# Patient Record
Sex: Female | Born: 2000 | Race: White | Hispanic: No | Marital: Single | Smoking: Never smoker
Health system: Southern US, Community
[De-identification: ages and names within clinical notes are randomized; demographics above are authoritative.]

## PROBLEM LIST (undated history)

## (undated) DIAGNOSIS — O24419 Gestational diabetes mellitus in pregnancy, unspecified control: Secondary | ICD-10-CM

## (undated) DIAGNOSIS — Z789 Other specified health status: Secondary | ICD-10-CM

## (undated) DIAGNOSIS — F909 Attention-deficit hyperactivity disorder, unspecified type: Secondary | ICD-10-CM

## (undated) DIAGNOSIS — O139 Gestational [pregnancy-induced] hypertension without significant proteinuria, unspecified trimester: Secondary | ICD-10-CM

## (undated) DIAGNOSIS — F419 Anxiety disorder, unspecified: Secondary | ICD-10-CM

## (undated) DIAGNOSIS — I10 Essential (primary) hypertension: Secondary | ICD-10-CM

## (undated) DIAGNOSIS — F32A Depression, unspecified: Secondary | ICD-10-CM

## (undated) HISTORY — PX: NO PAST SURGERIES: SHX2092

## (undated) HISTORY — DX: Attention-deficit hyperactivity disorder, unspecified type: F90.9

---
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
## 2020-01-03 ENCOUNTER — Ambulatory Visit (INDEPENDENT_AMBULATORY_CARE_PROVIDER_SITE_OTHER): Payer: Medicaid Other

## 2020-01-03 ENCOUNTER — Other Ambulatory Visit: Payer: Self-pay

## 2020-01-03 DIAGNOSIS — F320 Major depressive disorder, single episode, mild: Secondary | ICD-10-CM

## 2020-01-03 DIAGNOSIS — R8271 Bacteriuria: Secondary | ICD-10-CM

## 2020-01-03 DIAGNOSIS — O10919 Unspecified pre-existing hypertension complicating pregnancy, unspecified trimester: Secondary | ICD-10-CM | POA: Diagnosis not present

## 2020-01-03 DIAGNOSIS — Z6839 Body mass index (BMI) 39.0-39.9, adult: Secondary | ICD-10-CM

## 2020-01-03 DIAGNOSIS — O0990 Supervision of high risk pregnancy, unspecified, unspecified trimester: Secondary | ICD-10-CM

## 2020-01-03 DIAGNOSIS — O24410 Gestational diabetes mellitus in pregnancy, diet controlled: Secondary | ICD-10-CM

## 2020-01-03 DIAGNOSIS — F1290 Cannabis use, unspecified, uncomplicated: Secondary | ICD-10-CM

## 2020-01-03 DIAGNOSIS — Z283 Underimmunization status: Secondary | ICD-10-CM

## 2020-01-03 DIAGNOSIS — O09899 Supervision of other high risk pregnancies, unspecified trimester: Secondary | ICD-10-CM

## 2020-01-03 DIAGNOSIS — Z3490 Encounter for supervision of normal pregnancy, unspecified, unspecified trimester: Secondary | ICD-10-CM

## 2020-01-03 DIAGNOSIS — F32A Depression, unspecified: Secondary | ICD-10-CM

## 2020-01-03 NOTE — Progress Notes (Signed)
[place] 36+5 wks,cephalic,posterior placenta gr 3,fhr 142 bpm,bilateral renal pelvic dilation,LK 9.5 mm,RK 8.6 mm,BPP 8/8,AFI 14.8 CM

## 2020-01-05 ENCOUNTER — Encounter: Payer: Self-pay | Admitting: Obstetrics & Gynecology

## 2020-01-05 ENCOUNTER — Observation Stay
Admission: EM | Admit: 2020-01-05 | Discharge: 2020-01-06 | Disposition: A | Discharge status: Home / Self Care | Payer: Medicaid Other | Attending: Obstetrics & Gynecology | Admitting: Obstetrics & Gynecology

## 2020-01-05 DIAGNOSIS — O10913 Unspecified pre-existing hypertension complicating pregnancy, third trimester: Principal | ICD-10-CM | POA: Insufficient documentation

## 2020-01-05 DIAGNOSIS — Z3A37 37 weeks gestation of pregnancy: Secondary | ICD-10-CM | POA: Diagnosis not present

## 2020-01-05 DIAGNOSIS — O24419 Gestational diabetes mellitus in pregnancy, unspecified control: Secondary | ICD-10-CM | POA: Diagnosis not present

## 2020-01-05 DIAGNOSIS — O163 Unspecified maternal hypertension, third trimester: Secondary | ICD-10-CM | POA: Diagnosis present

## 2020-01-05 HISTORY — DX: Gestational (pregnancy-induced) hypertension without significant proteinuria, unspecified trimester: O13.9

## 2020-01-05 HISTORY — DX: Essential (primary) hypertension: I10

## 2020-01-05 HISTORY — DX: Gestational diabetes mellitus in pregnancy, unspecified control: O24.419

## 2020-01-05 LAB — COMPREHENSIVE METABOLIC PANEL
ALT: 20 U/L (ref 0–44)
AST: 18 U/L (ref 15–41)
Albumin: 2.6 g/dL — ABNORMAL LOW (ref 3.5–5.0)
Alkaline Phosphatase: 132 U/L — ABNORMAL HIGH (ref 38–126)
Anion gap: 6 (ref 5–15)
BUN: 11 mg/dL (ref 6–20)
CO2: 24 mmol/L (ref 22–32)
Calcium: 9.1 mg/dL (ref 8.9–10.3)
Chloride: 106 mmol/L (ref 98–111)
Creatinine, Ser: 0.71 mg/dL (ref 0.44–1.00)
GFR, Estimated: >60 mL/min (ref >60)
Glucose, Bld: 102 mg/dL — ABNORMAL HIGH (ref 70–99)
Potassium: 4 mmol/L (ref 3.5–5.1)
Sodium: 136 mmol/L (ref 135–145)
Total Bilirubin: 0.4 mg/dL (ref 0.3–1.2)
Total Protein: 5.9 g/dL — ABNORMAL LOW (ref 6.5–8.1)

## 2020-01-05 LAB — CBC WITH DIFFERENTIAL/PLATELET
Abs Immature Granulocytes: 0.05 10*3/uL (ref 0.00–0.07)
Basophils Absolute: 0.1 10*3/uL (ref 0.0–0.1)
Basophils Relative: 0 %
Eosinophils Absolute: 0.1 10*3/uL (ref 0.0–0.5)
Eosinophils Relative: 1 %
HCT: 32.2 % — ABNORMAL LOW (ref 36.0–46.0)
Hemoglobin: 10.8 g/dL — ABNORMAL LOW (ref 12.0–15.0)
Immature Granulocytes: 0 %
Lymphocytes Relative: 28 %
Lymphs Abs: 3.5 10*3/uL (ref 0.7–4.0)
MCH: 26.5 pg (ref 26.0–34.0)
MCHC: 33.5 g/dL (ref 30.0–36.0)
MCV: 78.9 fL — ABNORMAL LOW (ref 80.0–100.0)
Monocytes Absolute: 0.8 10*3/uL (ref 0.1–1.0)
Monocytes Relative: 6 %
Neutro Abs: 7.9 10*3/uL — ABNORMAL HIGH (ref 1.7–7.7)
Neutrophils Relative %: 65 %
Platelets: 244 10*3/uL (ref 150–400)
RBC: 4.08 MIL/uL (ref 3.87–5.11)
RDW: 13.8 % (ref 11.5–15.5)
WBC: 12.4 10*3/uL — ABNORMAL HIGH (ref 4.0–10.5)
nRBC: 0 % (ref 0.0–0.2)

## 2020-01-05 LAB — URINALYSIS, ROUTINE W REFLEX MICROSCOPIC
Bilirubin Urine: NEGATIVE
Glucose, UA: NEGATIVE mg/dL
Hgb urine dipstick: NEGATIVE
Ketones, ur: NEGATIVE mg/dL
Leukocytes,Ua: NEGATIVE
Nitrite: NEGATIVE
Protein, ur: 30 mg/dL — AB
Specific Gravity, Urine: 1.019 (ref 1.005–1.030)
pH: 6 (ref 5.0–8.0)

## 2020-01-05 NOTE — OB Triage Note (Signed)
Pt is G1P0 at 19yo with c/o HTN. Has a hx of CHTN and has been prescribed medication that she doesn't take r/t side effects. Pt states no blurry vision, double vision, epigastric pain or HA. States earlier she had a headache earlier that comes and goes. She has not taken any medication for her HA. Pt reports good fetal movement and states her blood glucose has been in the normal range.

## 2020-01-06 DIAGNOSIS — O163 Unspecified maternal hypertension, third trimester: Secondary | ICD-10-CM | POA: Diagnosis present

## 2020-01-06 DIAGNOSIS — O10913 Unspecified pre-existing hypertension complicating pregnancy, third trimester: Secondary | ICD-10-CM | POA: Diagnosis not present

## 2020-01-06 LAB — PROTEIN / CREATININE RATIO, URINE
Creatinine, Urine: 169 mg/dL
Protein Creatinine Ratio: 0.13 mg/mg{Cre} (ref 0.00–0.15)
Total Protein, Urine: 22 mg/dL

## 2020-01-06 NOTE — Discharge Summary (Signed)
Patient ID: Penny Wilson MRN: [id number] DOB/AGE: 2000/08/16 19 y.o.  Admit date: 01/05/2020 Discharge date: 01/06/2020  Admission Diagnoses: Elevated BP  Factors complicating pregnancy: 1. CHTN 2. GDM  Discharge Diagnoses: CHTN  Prenatal Procedures: NST  Consults: None  Significant Diagnostic Studies:  Results for orders placed or performed during the hospital encounter of 01/05/20 (from the past 168 hour(s))  Urinalysis, Routine w reflex microscopic Urine, Clean Catch   Collection Time: 01/05/20  9:52 PM  Result Value Ref Range   Color, Urine YELLOW (A) YELLOW   APPearance HAZY (A) CLEAR   Specific Gravity, Urine 1.019 1.005 - 1.030   pH 6.0 5.0 - 8.0   Glucose, UA NEGATIVE NEGATIVE mg/dL   Hgb urine dipstick NEGATIVE NEGATIVE   Bilirubin Urine NEGATIVE NEGATIVE   Ketones, ur NEGATIVE NEGATIVE mg/dL   Protein, ur 30 (A) NEGATIVE mg/dL   Nitrite NEGATIVE NEGATIVE   Leukocytes,Ua NEGATIVE NEGATIVE   RBC / HPF 6-10 0 - 5 RBC/hpf   WBC, UA 0-5 0 - 5 WBC/hpf   Bacteria, UA RARE (A) NONE SEEN   Squamous Epithelial / LPF 0-5 0 - 5   Mucus PRESENT   Protein / creatinine ratio, urine   Collection Time: 01/05/20  9:52 PM  Result Value Ref Range   Creatinine, Urine 169 mg/dL   Total Protein, Urine 22 mg/dL   Protein Creatinine Ratio 0.13 0.00 - 0.15 mg/mg[Cre]  CBC with Differential   Collection Time: 01/05/20 10:36 PM  Result Value Ref Range   WBC 12.4 (H) 4.0 - 10.5 K/uL   RBC 4.08 3.87 - 5.11 MIL/uL   Hemoglobin 10.8 (L) 12.0 - 15.0 g/dL   HCT 32.2 (L) 36 - 46 %   MCV 78.9 (L) 80.0 - 100.0 fL   MCH 26.5 26.0 - 34.0 pg   MCHC 33.5 30.0 - 36.0 g/dL   RDW 13.8 11.5 - 15.5 %   Platelets 244 150 - 400 K/uL   nRBC 0.0 0.0 - 0.2 %   Neutrophils Relative % 65 %   Neutro Abs 7.9 (H) 1.7 - 7.7 K/uL   Lymphocytes Relative 28 %   Lymphs Abs 3.5 0.7 - 4.0 K/uL   Monocytes Relative 6 %   Monocytes Absolute 0.8 0.1 - 1.0 K/uL   Eosinophils Relative 1 %   Eosinophils  Absolute 0.1 0.0 - 0.5 K/uL   Basophils Relative 0 %   Basophils Absolute 0.1 0.0 - 0.1 K/uL   Immature Granulocytes 0 %   Abs Immature Granulocytes 0.05 0.00 - 0.07 K/uL  Comprehensive metabolic panel   Collection Time: 01/05/20 10:36 PM  Result Value Ref Range   Sodium 136 135 - 145 mmol/L   Potassium 4.0 3.5 - 5.1 mmol/L   Chloride 106 98 - 111 mmol/L   CO2 24 22 - 32 mmol/L   Glucose, Bld 102 (H) 70 - 99 mg/dL   BUN 11 6 - 20 mg/dL   Creatinine, Ser 0.71 0.44 - 1.00 mg/dL   Calcium 9.1 8.9 - 10.3 mg/dL   Total Protein 5.9 (L) 6.5 - 8.1 g/dL   Albumin 2.6 (L) 3.5 - 5.0 g/dL   AST 18 15 - 41 U/L   ALT 20 0 - 44 U/L   Alkaline Phosphatase 132 (H) 38 - 126 U/L   Total Bilirubin 0.4 0.3 - 1.2 mg/dL   GFR, Estimated >60 >60 mL/min   Anion gap 6 5 - 15  Results for orders placed or performed in visit on  12/31/19 (from the past 168 hour(s))  POC Urinalysis Dipstick OB   Collection Time: 12/31/19  4:32 PM  Result Value Ref Range   Color, UA     Clarity, UA     Glucose, UA Negative Negative   Bilirubin, UA     Ketones, UA neg    Spec Grav, UA     Blood, UA neg    pH, UA     POC,PROTEIN,UA Trace Negative, Trace, Small (1+), Moderate (2+), Large (3+), 4+   Urobilinogen, UA     Nitrite, UA neg    Leukocytes, UA Negative Negative   Appearance     Odor      Treatments: none  Hospital Course:  This is a 19 y.o. G1P0 with IUP at 11w1dadmitted for elevated BPs.  No leaking of fluid and no bleeding.  Denies HA, changes in vision, or epigastric pain.  She was observed, labs collected, fetal heart rate monitoring remained reassuring, and she had no signs/symptoms of other maternal-fetal concerns.  She was deemed stable for discharge to home with outpatient follow up.  Discharge Physical Exam:  BP 107/75    Pulse 83    Resp 18    LMP 04/21/2019 (Approximate)    Vitals:   01/05/20 2242 01/05/20 2312 01/05/20 2342 01/06/20 0012  BP: 128/77 111/67 128/83 107/75  Pulse: 94 91 94 83   Resp:        General: NAD CV: RRR Pulm: nl effort ABD: s/nd/nt, gravid DVT Evaluation: LE non-ttp, no evidence of DVT on exam.  NST: FHR baseline: 125 bpm Variability: moderate Accelerations: yes Decelerations: none Time: 20 minutes Category/reactivity: reactive  TOCO: quiet SVE: deferred      Discharge Condition: Stable  Disposition: Discharge disposition: 01-Home or Self Care        Allergies as of 01/06/2020   No Known Allergies     Medication List    TAKE these medications   Accu-Chek Guide Me w/Device Kit 1 each by Does not apply route 4 (four) times daily.   Accu-Chek Guide test strip Generic drug: glucose blood Use as instructed to check blood sugar 4 times daily   Accu-Chek Softclix Lancets lancets Use as instructed to check blood sugar 4 times daily   aspirin 81 MG chewable tablet Chew 2 tablets (162 mg total) by mouth daily.   Blood Pressure Monitor Misc For regular home bp monitoring during pregnancy   labetalol 200 MG tablet Commonly known as: NORMODYNE Take 3 tablets (600 mg total) by mouth 2 (two) times daily.   PRENATAL VITAMIN PO Take by mouth.   promethazine 25 MG tablet Commonly known as: PHENERGAN Take 1 tablet (25 mg total) by mouth every 6 (six) hours as needed for nausea or vomiting.       [facility] Schedule an appointment as soon as possible for a visit on 01/06/2020.   Contact information: [email or web address]             Signed:  JLinda Hedges CNM 01/06/2020 1:13 AM

## 2020-01-06 NOTE — Discharge Instructions (Signed)
Preeclampsia and Eclampsia °Preeclampsia is a serious condition that may develop during pregnancy. This condition causes high blood pressure and increased protein in your urine along with other symptoms, such as headaches and vision changes. These symptoms may develop as the condition gets worse. Preeclampsia may occur at 20 weeks of pregnancy or later. °Diagnosing and treating preeclampsia early is very important. If not treated early, it can cause serious problems for you and your baby. One problem it can lead to is eclampsia. Eclampsia is a condition that causes muscle jerking or shaking (convulsions or seizures) and other serious problems for the mother. During pregnancy, delivering your baby may be the best treatment for preeclampsia or eclampsia. For most women, preeclampsia and eclampsia symptoms go away after giving birth. °In rare cases, a woman may develop preeclampsia after giving birth (postpartum preeclampsia). This usually occurs within 48 hours after childbirth but may occur up to 6 weeks after giving birth. °What are the causes? °The cause of preeclampsia is not known. °What increases the risk? °The following risk factors make you more likely to develop preeclampsia: °· Being pregnant for the first time. °· Having had preeclampsia during a past pregnancy. °· Having a family history of preeclampsia. °· Having high blood pressure. °· Being pregnant with more than one baby. °· Being 35 or older. °· Being African-American. °· Having kidney disease or diabetes. °· Having medical conditions such as lupus or blood diseases. °· Being very overweight (obese). °What are the signs or symptoms? °The most common symptoms are: °· Severe headaches. °· Vision problems, such as blurred or double vision. °· Abdominal pain, especially upper abdominal pain. °Other symptoms that may develop as the condition gets worse include: °· Sudden weight gain. °· Sudden swelling of the hands, face, legs, and feet. °· Severe nausea  and vomiting. °· Numbness in the face, arms, legs, and feet. °· Dizziness. °· Urinating less than usual. °· Slurred speech. °· Convulsions or seizures. °How is this diagnosed? °There are no screening tests for preeclampsia. Your health care provider will ask you about symptoms and check for signs of preeclampsia during your prenatal visits. You may also have tests that include: °· Checking your blood pressure. °· Urine tests to check for protein. Your health care provider will check for this at every prenatal visit. °· Blood tests. °· Monitoring your baby's heart rate. °· Ultrasound. °How is this treated? °You and your health care provider will determine the treatment approach that is best for you. Treatment may include: °· Having more frequent prenatal exams to check for signs of preeclampsia, if you have an increased risk for preeclampsia. °· Medicine to lower your blood pressure. °· Staying in the hospital, if your condition is severe. There, treatment will focus on controlling your blood pressure and the amount of fluids in your body (fluid retention). °· Taking medicine (magnesium sulfate) to prevent seizures. This may be given as an injection or through an IV. °· Taking a low-dose aspirin during your pregnancy. °· Delivering your baby early. You may have your labor started with medicine (induced), or you may have a cesarean delivery. °Follow these instructions at home: °Eating and drinking ° °· Drink enough fluid to keep your urine pale yellow. °· Avoid caffeine. °Lifestyle °· Do not use any products that contain nicotine or tobacco, such as cigarettes and e-cigarettes. If you need help quitting, ask your health care provider. °· Do not use alcohol or drugs. °· Avoid stress as much as possible. Rest and get   plenty of sleep. General instructions  Take over-the-counter and prescription medicines only as told by your health care provider.  When lying down, lie on your left side. This keeps pressure off your  major blood vessels.  When sitting or lying down, raise (elevate) your feet. Try putting some pillows underneath your lower legs.  Exercise regularly. Ask your health care provider what kinds of exercise are best for you.  Keep all follow-up and prenatal visits as told by your health care provider. This is important. How is this prevented? There is no known way of preventing preeclampsia or eclampsia from developing. However, to lower your risk of complications and detect problems early:  Get regular prenatal care. Your health care provider may be able to diagnose and treat the condition early.  Maintain a healthy weight. Ask your health care provider for help managing weight gain during pregnancy.  Work with your health care provider to manage any long-term (chronic) health conditions you have, such as diabetes or kidney problems.  You may have tests of your blood pressure and kidney function after giving birth.  Your health care provider may have you take low-dose aspirin during your next pregnancy. Contact a health care provider if:  You have symptoms that your health care provider told you may require more treatment or monitoring, such as: ? Headaches. ? Nausea or vomiting. ? Abdominal pain. ? Dizziness. ? Light-headedness. Get help right away if:  You have severe: ? Abdominal pain. ? Headaches that do not get better. ? Dizziness. ? Vision problems. ? Confusion. ? Nausea or vomiting.  You have any of the following: ? A seizure. ? Sudden, rapid weight gain. ? Sudden swelling in your hands, ankles, or face. ? Trouble moving any part of your body. ? Numbness in any part of your body. ? Trouble speaking. ? Abnormal bleeding.  You faint. Summary  Preeclampsia is a serious condition that may develop during pregnancy.  This condition causes high blood pressure and increased protein in your urine along with other symptoms, such as headaches and vision  changes.  Diagnosing and treating preeclampsia early is very important. If not treated early, it can cause serious problems for you and your baby.  Get help right away if you have symptoms that your health care provider told you to watch for. This information is not intended to replace advice given to you by your health care provider. Make sure you discuss any questions you have with your health care provider. Document Revised: 10/31/2017 Document Reviewed: 10/05/2015 Elsevier Patient Education  2020 [employer]   Preeclampsia and Eclampsia Preeclampsia is a serious condition that may develop during pregnancy. This condition causes high blood pressure and increased protein in your urine along with other symptoms, such as headaches and vision changes. These symptoms may develop as the condition gets worse. Preeclampsia may occur at 20 weeks of pregnancy or later. Diagnosing and treating preeclampsia early is very important. If not treated early, it can cause serious problems for you and your baby. One problem it can lead to is eclampsia. Eclampsia is a condition that causes muscle jerking or shaking (convulsions or seizures) and other serious problems for the mother. During pregnancy, delivering your baby may be the best treatment for preeclampsia or eclampsia. For most women, preeclampsia and eclampsia symptoms go away after giving birth. In rare cases, a woman may develop preeclampsia after giving birth (postpartum preeclampsia). This usually occurs within 48 hours after childbirth but may occur up to 6 weeks  after giving birth. What are the causes? The cause of preeclampsia is not known. What increases the risk? The following risk factors make you more likely to develop preeclampsia:  Being pregnant for the first time.  Having had preeclampsia during a past pregnancy.  Having a family history of preeclampsia.  Having high blood pressure.  Being pregnant with more than one  baby.  Being 49 or older.  Being African-American.  Having kidney disease or diabetes.  Having medical conditions such as lupus or blood diseases.  Being very overweight (obese). What are the signs or symptoms? The most common symptoms are:  Severe headaches.  Vision problems, such as blurred or double vision.  Abdominal pain, especially upper abdominal pain. Other symptoms that may develop as the condition gets worse include:  Sudden weight gain.  Sudden swelling of the hands, face, legs, and feet.  Severe nausea and vomiting.  Numbness in the face, arms, legs, and feet.  Dizziness.  Urinating less than usual.  Slurred speech.  Convulsions or seizures. How is this diagnosed? There are no screening tests for preeclampsia. Your health care provider will ask you about symptoms and check for signs of preeclampsia during your prenatal visits. You may also have tests that include:  Checking your blood pressure.  Urine tests to check for protein. Your health care provider will check for this at every prenatal visit.  Blood tests.  Monitoring your baby's heart rate.  Ultrasound. How is this treated? You and your health care provider will determine the treatment approach that is best for you. Treatment may include:  Having more frequent prenatal exams to check for signs of preeclampsia, if you have an increased risk for preeclampsia.  Medicine to lower your blood pressure.  Staying in the hospital, if your condition is severe. There, treatment will focus on controlling your blood pressure and the amount of fluids in your body (fluid retention).  Taking medicine (magnesium sulfate) to prevent seizures. This may be given as an injection or through an IV.  Taking a low-dose aspirin during your pregnancy.  Delivering your baby early. You may have your labor started with medicine (induced), or you may have a cesarean delivery. Follow these instructions at  home: Eating and drinking   Drink enough fluid to keep your urine pale yellow.  Avoid caffeine. Lifestyle  Do not use any products that contain nicotine or tobacco, such as cigarettes and e-cigarettes. If you need help quitting, ask your health care provider.  Do not use alcohol or drugs.  Avoid stress as much as possible. Rest and get plenty of sleep. General instructions  Take over-the-counter and prescription medicines only as told by your health care provider.  When lying down, lie on your left side. This keeps pressure off your major blood vessels.  When sitting or lying down, raise (elevate) your feet. Try putting some pillows underneath your lower legs.  Exercise regularly. Ask your health care provider what kinds of exercise are best for you.  Keep all follow-up and prenatal visits as told by your health care provider. This is important. How is this prevented? There is no known way of preventing preeclampsia or eclampsia from developing. However, to lower your risk of complications and detect problems early:  Get regular prenatal care. Your health care provider may be able to diagnose and treat the condition early.  Maintain a healthy weight. Ask your health care provider for help managing weight gain during pregnancy.  Work with your health care provider  to manage any long-term (chronic) health conditions you have, such as diabetes or kidney problems.  You may have tests of your blood pressure and kidney function after giving birth.  Your health care provider may have you take low-dose aspirin during your next pregnancy. Contact a health care provider if:  You have symptoms that your health care provider told you may require more treatment or monitoring, such as: ? Headaches. ? Nausea or vomiting. ? Abdominal pain. ? Dizziness. ? Light-headedness. Get help right away if:  You have severe: ? Abdominal pain. ? Headaches that do not get  better. ? Dizziness. ? Vision problems. ? Confusion. ? Nausea or vomiting.  You have any of the following: ? A seizure. ? Sudden, rapid weight gain. ? Sudden swelling in your hands, ankles, or face. ? Trouble moving any part of your body. ? Numbness in any part of your body. ? Trouble speaking. ? Abnormal bleeding.  You faint. Summary  Preeclampsia is a serious condition that may develop during pregnancy.  This condition causes high blood pressure and increased protein in your urine along with other symptoms, such as headaches and vision changes.  Diagnosing and treating preeclampsia early is very important. If not treated early, it can cause serious problems for you and your baby.  Get help right away if you have symptoms that your health care provider told you to watch for. This information is not intended to replace advice given to you by your health care provider. Make sure you discuss any questions you have with your health care provider. Document Revised: 10/31/2017 Document Reviewed: 10/05/2015 Elsevier Patient Education  2020 [employer].

## 2020-01-07 ENCOUNTER — Other Ambulatory Visit: Payer: Medicaid Other

## 2020-01-07 ENCOUNTER — Encounter: Payer: Medicaid Other | Admitting: Obstetrics & Gynecology

## 2020-01-07 DIAGNOSIS — O0993 Supervision of high risk pregnancy, unspecified, third trimester: Secondary | ICD-10-CM

## 2020-01-07 LAB — OB RESULTS CONSOLE GC/CHLAMYDIA
Chlamydia: NEGATIVE
Gonorrhea: NEGATIVE

## 2020-01-07 LAB — OB RESULTS CONSOLE GBS: GBS: NEGATIVE

## 2020-01-09 ENCOUNTER — Encounter: Payer: Self-pay | Admitting: Obstetrics and Gynecology

## 2020-01-09 ENCOUNTER — Observation Stay
Admission: EM | Admit: 2020-01-09 | Discharge: 2020-01-09 | Disposition: A | Discharge status: Home / Self Care | Payer: Medicaid Other | Attending: Obstetrics and Gynecology | Admitting: Obstetrics and Gynecology

## 2020-01-09 ENCOUNTER — Other Ambulatory Visit: Payer: Self-pay | Admitting: Obstetrics & Gynecology

## 2020-01-09 ENCOUNTER — Other Ambulatory Visit: Payer: Self-pay

## 2020-01-09 DIAGNOSIS — O0993 Supervision of high risk pregnancy, unspecified, third trimester: Secondary | ICD-10-CM | POA: Diagnosis not present

## 2020-01-09 DIAGNOSIS — F320 Major depressive disorder, single episode, mild: Secondary | ICD-10-CM

## 2020-01-09 DIAGNOSIS — O24410 Gestational diabetes mellitus in pregnancy, diet controlled: Secondary | ICD-10-CM

## 2020-01-09 DIAGNOSIS — O10919 Unspecified pre-existing hypertension complicating pregnancy, unspecified trimester: Secondary | ICD-10-CM

## 2020-01-09 DIAGNOSIS — O99891 Other specified diseases and conditions complicating pregnancy: Secondary | ICD-10-CM | POA: Diagnosis not present

## 2020-01-09 DIAGNOSIS — F32A Depression, unspecified: Secondary | ICD-10-CM

## 2020-01-09 DIAGNOSIS — R8271 Bacteriuria: Secondary | ICD-10-CM

## 2020-01-09 DIAGNOSIS — O10913 Unspecified pre-existing hypertension complicating pregnancy, third trimester: Secondary | ICD-10-CM | POA: Diagnosis present

## 2020-01-09 DIAGNOSIS — O0990 Supervision of high risk pregnancy, unspecified, unspecified trimester: Secondary | ICD-10-CM

## 2020-01-09 DIAGNOSIS — F1290 Cannabis use, unspecified, uncomplicated: Secondary | ICD-10-CM

## 2020-01-09 DIAGNOSIS — Z283 Underimmunization status: Secondary | ICD-10-CM | POA: Diagnosis not present

## 2020-01-09 DIAGNOSIS — Z6839 Body mass index (BMI) 39.0-39.9, adult: Secondary | ICD-10-CM

## 2020-01-09 DIAGNOSIS — Z3A38 38 weeks gestation of pregnancy: Secondary | ICD-10-CM | POA: Diagnosis not present

## 2020-01-09 DIAGNOSIS — O09899 Supervision of other high risk pregnancies, unspecified trimester: Secondary | ICD-10-CM

## 2020-01-09 DIAGNOSIS — Z3490 Encounter for supervision of normal pregnancy, unspecified, unspecified trimester: Secondary | ICD-10-CM

## 2020-01-09 MED ORDER — HYDROCODONE-ACETAMINOPHEN 5-325 MG PO TABS
1.0000 | ORAL_TABLET | Freq: Once | ORAL | Status: AC
  Administered 2020-01-09: 1 via ORAL
  Filled 2020-01-09: qty 1

## 2020-01-09 NOTE — Discharge Instructions (Signed)
Return to hospital for any of the following:  -decreased fetal movement  -contractions at least 5 minutes apart  -vaginal bleeding  -leakage of fluid

## 2020-01-09 NOTE — OB Triage Note (Signed)
Pt reports to unit c/o elevated BP at home. Pt reports a hx of CHTN and has just started taking 200mg  of labetalol yesterday. Pt reports +FM, denies LOF, vaginal bleeding, and ctx. Pt does report a HA but has not taken any medication. Pt denies right epigastric, blurry vision. Initial BP 119/72, cycling q83min. Will continue to monitor.

## 2020-01-10 ENCOUNTER — Encounter: Payer: Medicaid Other | Admitting: Obstetrics & Gynecology

## 2020-01-10 ENCOUNTER — Other Ambulatory Visit: Payer: Medicaid Other

## 2020-01-14 ENCOUNTER — Other Ambulatory Visit: Payer: Medicaid Other

## 2020-01-14 ENCOUNTER — Other Ambulatory Visit: Payer: Self-pay | Admitting: Obstetrics & Gynecology

## 2020-01-14 ENCOUNTER — Encounter: Payer: Self-pay | Admitting: *Deleted

## 2020-01-14 NOTE — Progress Notes (Signed)
OB History & Physical   History of Present Illness:  Chief Complaint:   HPI:  Penny Wilson is a 19 y.o. G1P0 female at 83w2ddated by LMP of 04/21/19 . Estimated Date of Delivery: 01/26/20  She presents to L&D for induction of labor due to her co-morbidities.  +FM, no CTX, no LOF, no VB  Pregnancy Issues: 1. Chronic HTN, on increasing doses of labetalol.  Currently on 4056mBID  2. GDMA1  3. Chronically elevated LFTs  4. Obesity - prepregnancy 40 5. 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 Late transfer of care due to social issues  Maternal Medical History:   Past Medical History:  Diagnosis Date  . Gestational diabetes   . Hypertension   . Pregnancy induced hypertension     Past Surgical History:  Procedure Laterality Date  . NO PAST SURGERIES      No Known Allergies  Prior to Admission medications   Medication Sig Start Date End Date Taking? Authorizing Provider  Accu-Chek Softclix Lancets lancets Use as instructed to check blood sugar 4 times daily 10/28/19   ShMyrtis SerCNM  aspirin 81 MG chewable tablet Chew 2 tablets (162 mg total) by mouth daily. 07/17/19   ShMyrtis SerCNM  Blood Glucose Monitoring Suppl (ACCU-CHEK GUIDE ME) w/Device KIT 1 each by Does not apply route 4 (four) times daily. 10/28/19   ShMyrtis SerCNM  Blood Pressure Monitor MISC For regular home bp monitoring during pregnancy 07/17/19   ShMyrtis SerCNM  glucose blood (ACCU-CHEK GUIDE) test strip Use as instructed to check blood sugar 4 times daily 10/28/19   ShMyrtis SerCNM  labetalol (NORMODYNE) 200 MG tablet Take 3 tablets (600 mg total) by mouth 2 (two) times daily. Patient taking differently: Take 200 mg by mouth 2 (two) times daily.  11/20/19   ShMyrtis SerCNM  Prenatal Vit-Fe Fumarate-FA (PRENATAL VITAMIN PO) Take by mouth.    [provider]  promethazine (PHENERGAN) 25 MG tablet Take 1 tablet (25 mg total) by mouth every 6 (six) hours as needed for nausea or vomiting.  10/23/19   ShMyrtis SerCNM     [facility] [facility] [place] She  reports that she has never smoked. She has never used smokeless tobacco. She reports current drug use. Drug: Marijuana. She reports that she does not drink alcohol.  Family History: family history includes Narcolepsy in her mother.   Review of Systems: A full review of systems was performed and negative except as noted in the HPI.     Physical Exam:  Vital Signs: LMP 04/21/2019 (Approximate)  General: no acute distress.  HEENT: normocephalic, atraumatic Heart: regular rate & rhythm.  No murmurs/rubs/gallops Lungs: clear to auscultation bilaterally, normal respiratory effort Abdomen: soft, gravid, non-tender;  EFW: 7.13 Pelvic:   External: Normal external female genitalia  Cervix:   closed, long, high, soft, posterior   Extremities: non-tender, symmetric, 1+ edema bilaterally.  DTRs: 2+ Neurologic: Alert & oriented x 3.      Pertinent Results:  Prenatal Labs: Blood type/Rh O pos  Antibody screen neg  Rubella Immune  Varicella Immune  RPR NR  HBsAg Neg  HIV NR  GC neg  Chlamydia neg  Genetic screening negative  1 hour GTT --  2 hour GTT  118 - 204 - 180   GBS neg    Cephalic by ultrasound performed in office today.  [place] Follow Up  Result Date: 12/20/2019  FOLLOW UP SONOGRAM Penny Wilson is in the office for a follow up sonogram for BPP,EFW and cord dopplers. She is a 19 y.o. year old G1P0 with Estimated Date of Delivery: 01/26/20 by LMP now at  58w5dweeks gestation. Thus far the pregnancy has been complicated by CHTN,GDM A1. GESTATION: SINGLETON PRESENTATION: Transverse head left FETAL ACTIVITY:          Heart rate         137          The fetus is active. AMNIOTIC FLUID: The amniotic fluid volume is  normal, 15.6 cm. PLACENTA LOCALIZATION:  posterior GRADE 0 CERVIX: Measures 3.1 cm GESTATIONAL AGE AND  BIOMETRICS: Gestational criteria: Estimated Date of Delivery:  01/26/20 by LMP now at 366w5drevious Scans:4          BIPARIETAL DIAMETER           8.65 cm         34+6 weeks HEAD CIRCUMFERENCE           32.26 cm         36+3 weeks ABDOMINAL CIRCUMFERENCE           31.91 cm         35+5 weeks FEMUR LENGTH           6.61 cm         34 weeks                                                       AVERAGE EGA(BY THIS SCAN):  35+1 weeks                                                 ESTIMATED FETAL WEIGHT:       2650  grams, 64 % BIOPHYSICAL PROFILE:                                                                                                      COMMENTS GROSS BODY MOVEMENT                 2  TONE                2  RESPIRATIONS                2  AMNIOTIC FLUID                2                                                          SCORE:  8/8 (Note: NST was not performed as part of this antepartum testing) DOPPLER FLOW STUDIES: UMBILICAL ARTERY RI RATIOS:   elevated UAD with end diastolic flow,RI .12,.87,.86=76% ANATOMICAL SURVEY                                                                            COMMENTS CEREBRAL VENTRICLES yes normal  CHOROID PLEXUS yes normal  CEREBELLUM yes normal                  NOSE/LIP yes normal  FACIAL PROFILE yes normal  4 CHAMBERED HEART yes normal  OUTFLOW TRACTS Limited view    DIAPHRAGM yes normal  STOMACH yes normal  RENAL REGION yes Abnormal  bilat renal pelvic dilation, left renal pelvis 9.1 mm,right renal pelvis 7.4 mm BLADDER yes normal  CORD INSERTION yes normal  3 VESSEL CORD yes normal              GENITALIA yes normal female     SUSPECTED ABNORMALITIES: bilat renal pelvic dilation, left renal pelvis 9.1 mm,right renal pelvis 7.4 mm elevated UAD with end diastolic flow 72% QUALITY OF SCAN: Limited view TECHNICIAN COMMENTS: [place] 34+5 wks,transverse head left,BPP 8/8,cx 3.1 cm,AFI 15.6 cm,fhr 137 bpm,bilat renal pelvic dilation, left renal pelvis 9.1 mm,right renal pelvis 7.4 mm,elevated UAD with end diastolic flow,RI  .09,.47,.09=62%,EZM 2650 g 64% A copy of this report including all images has been saved and backed up to a second source for retrieval if needed. All measures and details of the anatomical scan, placentation, fluid volume and pelvic anatomy are contained in that report. Amber Heide Guile 12/20/2019 12:19 PM Clinical Impression and recommendations: I have reviewed the sonogram results above, combined with the patient's current clinical course, below are my impressions and any appropriate recommendations for management based on the sonographic findings. 1.  G1P0 Estimated Date of Delivery: 01/26/20 by serial sonographic evaluations 2.  Fetal sonographic surveillance findings: a). Normal fluid volume b). Normal antepartum fetal assessment with BPP 8/8 c). Elevatedl fetal Doppler ratios with consistent diastolic flow  62% d). Normal growth percentile with appropriate interval growth 64% Stable right RPD 3.  Normal general sonographic findings Recommend continued prenatal evaluations and care based on this sonogram and as clinically indicated from the patient's clinical course. Mertie Clause Eure 12/20/2019 12:25 PM   US Fetal BPP W/O Non Stress  Result Date: 01/06/2020 FOLLOW UP SONOGRAM Penny Wilson is in the office for a follow up sonogram for BPP and cord dopplers. She is a 19 y.o. year old G1P0 with Estimated Date of Delivery: 01/26/20 by LMP now at  46w5dweeks gestation. Thus far the pregnancy has been complicated by CHTN,GDM A1. GESTATION: SINGLETON PRESENTATION: cephalic FETAL ACTIVITY:          Heart rate         142          The fetus is active. AMNIOTIC FLUID: The amniotic fluid volume is  normal, 14.8 cm. PLACENTA LOCALIZATION:  posterior GRADE 3 CERVIX: Limited view GESTATIONAL AGE AND  BIOMETRICS: Gestational criteria: Estimated Date of Delivery: 01/26/20 by LMP now at 313w5drevious Scans:5 BIOPHYSICAL PROFILE:  COMMENTS GROSS BODY MOVEMENT                 2  TONE                2  RESPIRATIONS                2  AMNIOTIC FLUID                2                                                          SCORE:  8/8 (Note: NST was not performed as part of this antepartum testing) DOPPLER FLOW STUDIES: UMBILICAL ARTERY RI RATIOS:   .69,.69=93% ANATOMICAL SURVEY                                                                            COMMENTS CEREBRAL VENTRICLES yes normal  CHOROID PLEXUS yes normal  CEREBELLUM yes normal  CISTERNA MAGNA yes normal                  FACIAL PROFILE yes normal  4 CHAMBERED HEART yes normal  OUTFLOW TRACTS yes normal  DIAPHRAGM yes normal  STOMACH yes normal  RENAL REGION yes  bilateral renal pelvic dilation,LK 9.5 mm,RK 8.6 mm BLADDER yes normal      3 VESSEL CORD yes normal              GENITALIA   female     SUSPECTED ABNORMALITIES:  bilateral renal pelvic dilation,LK 9.5 mm,RK 8.6 mm  RI .69,.69=93% QUALITY OF SCAN: satisfactory TECHNICIAN COMMENTS: [place] 81+1 wks,cephalic,posterior placenta gr 3,fhr 142 bpm,bilateral renal pelvic dilation,LK 9.5 mm,RK 8.6 mm,BPP 8/8,AFI 14.8 CM,RI .69,.69=93% A copy of this report including all images has been saved and backed up to a second source for retrieval if needed. All measures and details of the anatomical scan, placentation, fluid volume and pelvic anatomy are contained in that report. Amber Heide Guile 01/03/2020 12:25 PM Clinical Impression and recommendations: I have reviewed the sonogram results above, combined with the patient's current clinical course, below are my impressions and any appropriate recommendations for management based on the sonographic findings. 1.  G1P0 Estimated Date of Delivery: 01/26/20 by serial sonographic evaluations 2.  Fetal sonographic surveillance findings: a). Normal fluid volume b). Normal antepartum fetal assessment with BPP 8/8 c). Elevated  fetal Doppler ratios with consistent diastolic flow  57% Stable bilateral mild RPD  3.  Normal general sonographic findings Recommend continued prenatal evaluations and care based on this sonogram and as clinically indicated from the patient's clinical course. Mertie Clause Eure 01/06/2020 7:01 AM   US Fetal BPP W/O Non Stress  Result Date: 12/20/2019 FOLLOW UP SONOGRAM Penny Wilson is in the office for a follow up sonogram for BPP,EFW and cord dopplers. She is a 19 y.o. year old G1P0 with Estimated Date of Delivery: 01/26/20 by LMP now at  57w5dweeks gestation. Thus far the pregnancy has been complicated by CHTN,GDM  A1. GESTATION: SINGLETON PRESENTATION: Transverse head left FETAL ACTIVITY:          Heart rate         137          The fetus is active. AMNIOTIC FLUID: The amniotic fluid volume is  normal, 15.6 cm. PLACENTA LOCALIZATION:  posterior GRADE 0 CERVIX: Measures 3.1 cm GESTATIONAL AGE AND  BIOMETRICS: Gestational criteria: Estimated Date of Delivery: 01/26/20 by LMP now at 46w5dPrevious Scans:4          BIPARIETAL DIAMETER           8.65 cm         34+6 weeks HEAD CIRCUMFERENCE           32.26 cm         36+3 weeks ABDOMINAL CIRCUMFERENCE           31.91 cm         35+5 weeks FEMUR LENGTH           6.61 cm         34 weeks                                                       AVERAGE EGA(BY THIS SCAN):  35+1 weeks                                                 ESTIMATED FETAL WEIGHT:       2650  grams, 64 % BIOPHYSICAL PROFILE:                                                                                                      COMMENTS GROSS BODY MOVEMENT                 2  TONE                2  RESPIRATIONS                2  AMNIOTIC FLUID                2                                                          SCORE:  8/8 (Note: NST was not performed as part of this antepartum testing) DOPPLER FLOW STUDIES: UMBILICAL ARTERY RI RATIOS:   elevated UAD with end diastolic flow,RI .788,.91,.69=45%ANATOMICAL SURVEY  COMMENTS CEREBRAL VENTRICLES yes normal  CHOROID PLEXUS yes normal  CEREBELLUM yes normal                  NOSE/LIP yes normal  FACIAL PROFILE yes normal  4 CHAMBERED HEART yes normal  OUTFLOW TRACTS Limited view    DIAPHRAGM yes normal  STOMACH yes normal  RENAL REGION yes Abnormal  bilat renal pelvic dilation, left renal pelvis 9.1 mm,right renal pelvis 7.4 mm BLADDER yes normal  CORD INSERTION yes normal  3 VESSEL CORD yes normal              GENITALIA yes normal female     SUSPECTED ABNORMALITIES: bilat renal pelvic dilation, left renal pelvis 9.1 mm,right renal pelvis 7.4 mm elevated UAD with end diastolic flow 90% QUALITY OF SCAN: Limited view TECHNICIAN COMMENTS: [place] 34+5 wks,transverse head left,BPP 8/8,cx 3.1 cm,AFI 15.6 cm,fhr 137 bpm,bilat renal pelvic dilation, left renal pelvis 9.1 mm,right renal pelvis 7.4 mm,elevated UAD with end diastolic flow,RI .30,.09,.23=30%,QTM 2650 g 64% A copy of this report including all images has been saved and backed up to a second source for retrieval if needed. All measures and details of the anatomical scan, placentation, fluid volume and pelvic anatomy are contained in that report. Amber Heide Guile 12/20/2019 12:19 PM Clinical Impression and recommendations: I have reviewed the sonogram results above, combined with the patient's current clinical course, below are my impressions and any appropriate recommendations for management based on the sonographic findings. 1.  G1P0 Estimated Date of Delivery: 01/26/20 by serial sonographic evaluations 2.  Fetal sonographic surveillance findings: a). Normal fluid volume b). Normal antepartum fetal assessment with BPP 8/8 c). Elevatedl fetal Doppler ratios with consistent diastolic flow  22% d). Normal growth percentile with appropriate interval growth 64% Stable right RPD 3.  Normal general sonographic findings Recommend continued prenatal evaluations and care based on this sonogram and as clinically indicated from the patient's  clinical course. Mertie Clause Eure 12/20/2019 12:25 PM   [place] UA Cord Doppler  Result Date: 01/06/2020 FOLLOW UP SONOGRAM Penny Wilson is in the office for a follow up sonogram for BPP and cord dopplers. She is a 19 y.o. year old G1P0 with Estimated Date of Delivery: 01/26/20 by LMP now at  52w5dweeks gestation. Thus far the pregnancy has been complicated by CHTN,GDM A1. GESTATION: SINGLETON PRESENTATION: cephalic FETAL ACTIVITY:          Heart rate         142          The fetus is active. AMNIOTIC FLUID: The amniotic fluid volume is  normal, 14.8 cm. PLACENTA LOCALIZATION:  posterior GRADE 3 CERVIX: Limited view GESTATIONAL AGE AND  BIOMETRICS: Gestational criteria: Estimated Date of Delivery: 01/26/20 by LMP now at 325w5drevious Scans:5 BIOPHYSICAL PROFILE:                                                                                                      [place]  2  TONE                2  RESPIRATIONS                2  AMNIOTIC FLUID                2                                                          SCORE:  8/8 (Note: NST was not performed as part of this antepartum testing) DOPPLER FLOW STUDIES: UMBILICAL ARTERY RI RATIOS:   .69,.69=93% ANATOMICAL SURVEY                                                                            COMMENTS CEREBRAL VENTRICLES yes normal  CHOROID PLEXUS yes normal  CEREBELLUM yes normal  CISTERNA MAGNA yes normal                  FACIAL PROFILE yes normal  4 CHAMBERED HEART yes normal  OUTFLOW TRACTS yes normal  DIAPHRAGM yes normal  STOMACH yes normal  RENAL REGION yes  bilateral renal pelvic dilation,LK 9.5 mm,RK 8.6 mm BLADDER yes normal      3 VESSEL CORD yes normal              GENITALIA   female     SUSPECTED ABNORMALITIES:  bilateral renal pelvic dilation,LK 9.5 mm,RK 8.6 mm  RI .69,.69=93% QUALITY OF SCAN: satisfactory TECHNICIAN COMMENTS: [place] 40+9 wks,cephalic,posterior placenta gr 3,fhr 142 bpm,bilateral renal pelvic dilation,LK  9.5 mm,RK 8.6 mm,BPP 8/8,AFI 14.8 CM,RI .69,.69=93% A copy of this report including all images has been saved and backed up to a second source for retrieval if needed. All measures and details of the anatomical scan, placentation, fluid volume and pelvic anatomy are contained in that report. Amber Heide Guile 01/03/2020 12:25 PM Clinical Impression and recommendations: I have reviewed the sonogram results above, combined with the patient's current clinical course, below are my impressions and any appropriate recommendations for management based on the sonographic findings. 1.  G1P0 Estimated Date of Delivery: 01/26/20 by serial sonographic evaluations 2.  Fetal sonographic surveillance findings: a). Normal fluid volume b). Normal antepartum fetal assessment with BPP 8/8 c). Elevated  fetal Doppler ratios with consistent diastolic flow  73% Stable bilateral mild RPD 3.  Normal general sonographic findings Recommend continued prenatal evaluations and care based on this sonogram and as clinically indicated from the patient's clinical course. Mertie Clause Eure 01/06/2020 7:01 AM   [place] UA Cord Doppler  Result Date: 12/20/2019 FOLLOW UP SONOGRAM Penny Wilson is in the office for a follow up sonogram for BPP,EFW and cord dopplers. She is a 19 y.o. year old G1P0 with Estimated Date of Delivery: 01/26/20 by LMP now at  44w5dweeks gestation. Thus far the pregnancy has been complicated by CHTN,GDM A1. GESTATION: SINGLETON PRESENTATION: Transverse head left FETAL ACTIVITY:          Heart rate  137          The fetus is active. AMNIOTIC FLUID: The amniotic fluid volume is  normal, 15.6 cm. PLACENTA LOCALIZATION:  posterior GRADE 0 CERVIX: Measures 3.1 cm GESTATIONAL AGE AND  BIOMETRICS: Gestational criteria: Estimated Date of Delivery: 01/26/20 by LMP now at 11w5dPrevious Scans:4          BIPARIETAL DIAMETER           8.65 cm         34+6 weeks HEAD CIRCUMFERENCE           32.26 cm         36+3 weeks ABDOMINAL  CIRCUMFERENCE           31.91 cm         35+5 weeks FEMUR LENGTH           6.61 cm         34 weeks                                                       AVERAGE EGA(BY THIS SCAN):  35+1 weeks                                                 ESTIMATED FETAL WEIGHT:       2650  grams, 64 % BIOPHYSICAL PROFILE:                                                                                                      COMMENTS GROSS BODY MOVEMENT                 2  TONE                2  RESPIRATIONS                2  AMNIOTIC FLUID                2                                                          SCORE:  8/8 (Note: NST was not performed as part of this antepartum testing) DOPPLER FLOW STUDIES: UMBILICAL ARTERY RI RATIOS:   elevated UAD with end diastolic flow,RI .773,.22,.02=54%ANATOMICAL SURVEY  COMMENTS CEREBRAL VENTRICLES yes normal  CHOROID PLEXUS yes normal  CEREBELLUM yes normal                  NOSE/LIP yes normal  FACIAL PROFILE yes normal  4 CHAMBERED HEART yes normal  OUTFLOW TRACTS Limited view    DIAPHRAGM yes normal  STOMACH yes normal  RENAL REGION yes Abnormal  bilat renal pelvic dilation, left renal pelvis 9.1 mm,right renal pelvis 7.4 mm BLADDER yes normal  CORD INSERTION yes normal  3 VESSEL CORD yes normal              GENITALIA yes normal female     SUSPECTED ABNORMALITIES: bilat renal pelvic dilation, left renal pelvis 9.1 mm,right renal pelvis 7.4 mm elevated UAD with end diastolic flow 37% QUALITY OF SCAN: Limited view TECHNICIAN COMMENTS: [place] 34+5 wks,transverse head left,BPP 8/8,cx 3.1 cm,AFI 15.6 cm,fhr 137 bpm,bilat renal pelvic dilation, left renal pelvis 9.1 mm,right renal pelvis 7.4 mm,elevated UAD with end diastolic flow,RI .34,.28,.76=81%,LXB 2650 g 64% A copy of this report including all images has been saved and backed up to a second source for retrieval if needed. All measures and details of the anatomical scan,  placentation, fluid volume and pelvic anatomy are contained in that report. Amber Heide Guile 12/20/2019 12:19 PM Clinical Impression and recommendations: I have reviewed the sonogram results above, combined with the patient's current clinical course, below are my impressions and any appropriate recommendations for management based on the sonographic findings. 1.  G1P0 Estimated Date of Delivery: 01/26/20 by serial sonographic evaluations 2.  Fetal sonographic surveillance findings: a). Normal fluid volume b). Normal antepartum fetal assessment with BPP 8/8 c). Elevatedl fetal Doppler ratios with consistent diastolic flow  26% d). Normal growth percentile with appropriate interval growth 64% Stable right RPD 3.  Normal general sonographic findings Recommend continued prenatal evaluations and care based on this sonogram and as clinically indicated from the patient's clinical course. Florian Buff 12/20/2019 12:25 PM    Assessment:  Penny Wilson is a 19 y.o. G1P0 female at 69w2dwith chronic hypertension and gestational diabetes for induction of labor.   Plan:  1. Admit to Labor & Delivery 2. CBC, T&S, Clrs, IVF, CMP, P/C ratio 3. GBS neg  4. Consents obtained. 5. Continuous efm/toco 6. IOL: cytotec for cervical ripening 7. LFTs are chronically elevated and may not be reflective of preeclampsia / HELLP.    ----- CLarey Days MD Attending Obstetrician and Gynecologist [facility]

## 2020-01-15 ENCOUNTER — Other Ambulatory Visit: Payer: Self-pay

## 2020-01-15 ENCOUNTER — Inpatient Hospital Stay: Payer: Medicaid Other | Admitting: Anesthesiology

## 2020-01-15 ENCOUNTER — Encounter: Payer: Self-pay | Admitting: Obstetrics & Gynecology

## 2020-01-15 ENCOUNTER — Inpatient Hospital Stay
Admission: EM | Admit: 2020-01-15 | Discharge: 2020-01-18 | DRG: 806 | Disposition: A | Discharge status: Home / Self Care | Payer: Medicaid Other | Attending: Obstetrics & Gynecology | Admitting: Obstetrics & Gynecology

## 2020-01-15 DIAGNOSIS — O1002 Pre-existing essential hypertension complicating childbirth: Principal | ICD-10-CM | POA: Diagnosis present

## 2020-01-15 DIAGNOSIS — O0990 Supervision of high risk pregnancy, unspecified, unspecified trimester: Secondary | ICD-10-CM

## 2020-01-15 DIAGNOSIS — E669 Obesity, unspecified: Secondary | ICD-10-CM | POA: Diagnosis present

## 2020-01-15 DIAGNOSIS — O99344 Other mental disorders complicating childbirth: Secondary | ICD-10-CM | POA: Diagnosis present

## 2020-01-15 DIAGNOSIS — O24410 Gestational diabetes mellitus in pregnancy, diet controlled: Principal | ICD-10-CM

## 2020-01-15 DIAGNOSIS — O358XX0 Maternal care for other (suspected) fetal abnormality and damage, not applicable or unspecified: Secondary | ICD-10-CM | POA: Diagnosis present

## 2020-01-15 DIAGNOSIS — O26893 Other specified pregnancy related conditions, third trimester: Secondary | ICD-10-CM | POA: Diagnosis present

## 2020-01-15 DIAGNOSIS — O99891 Other specified diseases and conditions complicating pregnancy: Secondary | ICD-10-CM

## 2020-01-15 DIAGNOSIS — O24420 Gestational diabetes mellitus in childbirth, diet controlled: Secondary | ICD-10-CM | POA: Diagnosis present

## 2020-01-15 DIAGNOSIS — O09899 Supervision of other high risk pregnancies, unspecified trimester: Secondary | ICD-10-CM

## 2020-01-15 DIAGNOSIS — Z3A38 38 weeks gestation of pregnancy: Secondary | ICD-10-CM | POA: Diagnosis not present

## 2020-01-15 DIAGNOSIS — Z283 Underimmunization status: Secondary | ICD-10-CM

## 2020-01-15 DIAGNOSIS — F1290 Cannabis use, unspecified, uncomplicated: Secondary | ICD-10-CM

## 2020-01-15 DIAGNOSIS — Z6839 Body mass index (BMI) 39.0-39.9, adult: Secondary | ICD-10-CM

## 2020-01-15 DIAGNOSIS — F32A Depression, unspecified: Secondary | ICD-10-CM

## 2020-01-15 DIAGNOSIS — F320 Major depressive disorder, single episode, mild: Secondary | ICD-10-CM

## 2020-01-15 DIAGNOSIS — O99214 Obesity complicating childbirth: Secondary | ICD-10-CM | POA: Diagnosis present

## 2020-01-15 DIAGNOSIS — O10919 Unspecified pre-existing hypertension complicating pregnancy, unspecified trimester: Secondary | ICD-10-CM

## 2020-01-15 DIAGNOSIS — R8271 Bacteriuria: Secondary | ICD-10-CM

## 2020-01-15 DIAGNOSIS — Z20822 Contact with and (suspected) exposure to covid-19: Secondary | ICD-10-CM | POA: Diagnosis present

## 2020-01-15 DIAGNOSIS — F321 Major depressive disorder, single episode, moderate: Secondary | ICD-10-CM | POA: Diagnosis present

## 2020-01-15 HISTORY — DX: Anxiety disorder, unspecified: F41.9

## 2020-01-15 HISTORY — DX: Depression, unspecified: F32.A

## 2020-01-15 LAB — GLUCOSE, CAPILLARY
Glucose-Capillary: 87 mg/dL (ref 70–99)
Glucose-Capillary: 98 mg/dL (ref 70–99)
Glucose-Capillary: 82 mg/dL (ref 70–99)
Glucose-Capillary: 82 mg/dL (ref 70–99)
Glucose-Capillary: 77 mg/dL (ref 70–99)

## 2020-01-15 LAB — TYPE AND SCREEN
ABO/RH(D): O POS
Antibody Screen: NEGATIVE

## 2020-01-15 LAB — COMPREHENSIVE METABOLIC PANEL
ALT: 19 U/L (ref 0–44)
AST: 18 U/L (ref 15–41)
Albumin: 2.6 g/dL — ABNORMAL LOW (ref 3.5–5.0)
Alkaline Phosphatase: 136 U/L — ABNORMAL HIGH (ref 38–126)
Anion gap: 9 (ref 5–15)
BUN: 11 mg/dL (ref 6–20)
CO2: 22 mmol/L (ref 22–32)
Calcium: 9.4 mg/dL (ref 8.9–10.3)
Chloride: 105 mmol/L (ref 98–111)
Creatinine, Ser: 0.73 mg/dL (ref 0.44–1.00)
GFR, Estimated: >60 mL/min (ref >60)
Glucose, Bld: 90 mg/dL (ref 70–99)
Potassium: 3.9 mmol/L (ref 3.5–5.1)
Sodium: 136 mmol/L (ref 135–145)
Total Bilirubin: 0.5 mg/dL (ref 0.3–1.2)
Total Protein: 6.1 g/dL — ABNORMAL LOW (ref 6.5–8.1)

## 2020-01-15 LAB — ABO/RH: ABO/RH(D): O POS

## 2020-01-15 LAB — CBC
HCT: 31.1 % — ABNORMAL LOW (ref 36.0–46.0)
Hemoglobin: 10.2 g/dL — ABNORMAL LOW (ref 12.0–15.0)
MCH: 26.5 pg (ref 26.0–34.0)
MCHC: 32.8 g/dL (ref 30.0–36.0)
MCV: 80.8 fL (ref 80.0–100.0)
Platelets: 264 10*3/uL (ref 150–400)
RBC: 3.85 MIL/uL — ABNORMAL LOW (ref 3.87–5.11)
RDW: 14.7 % (ref 11.5–15.5)
WBC: 11 10*3/uL — ABNORMAL HIGH (ref 4.0–10.5)
nRBC: 0 % (ref 0.0–0.2)

## 2020-01-15 LAB — URINE DRUG SCREEN, QUALITATIVE (ARMC ONLY)
Amphetamines, Ur Screen: NOT DETECTED
Barbiturates, Ur Screen: NOT DETECTED
Benzodiazepine, Ur Scrn: NOT DETECTED
Cannabinoid 50 Ng, Ur ~~LOC~~: NOT DETECTED
Cocaine Metabolite,Ur ~~LOC~~: NOT DETECTED
MDMA (Ecstasy)Ur Screen: NOT DETECTED
Methadone Scn, Ur: NOT DETECTED
Opiate, Ur Screen: NOT DETECTED
Phencyclidine (PCP) Ur S: NOT DETECTED
Tricyclic, Ur Screen: NOT DETECTED

## 2020-01-15 LAB — RESPIRATORY PANEL BY RT PCR (FLU A&B, COVID)
Influenza A by PCR: NEGATIVE
Influenza B by PCR: NEGATIVE
SARS Coronavirus 2 by RT PCR: NEGATIVE

## 2020-01-15 LAB — RPR: RPR Ser Ql: NONREACTIVE

## 2020-01-15 LAB — PROTEIN / CREATININE RATIO, URINE
Creatinine, Urine: 213 mg/dL
Protein Creatinine Ratio: 0.29 mg/mg{Cre} — ABNORMAL HIGH (ref 0.00–0.15)
Total Protein, Urine: 62 mg/dL

## 2020-01-15 MED ORDER — LIDOCAINE HCL (PF) 2 % IJ SOLN
INTRAMUSCULAR | Status: DC | PRN
  Administered 2020-01-15: 100 mg via EPIDURAL
  Administered 2020-01-15: 60 mg via EPIDURAL

## 2020-01-15 MED ORDER — PHENYLEPHRINE 40 MCG/ML (10ML) SYRINGE FOR IV PUSH (FOR BLOOD PRESSURE SUPPORT): 80.0000 ug | PREFILLED_SYRINGE | INTRAVENOUS | Status: DC | PRN

## 2020-01-15 MED ORDER — LACTATED RINGERS IV SOLN
500.0000 mL | INTRAVENOUS | Status: DC | PRN
  Administered 2020-01-15 (×2): 500 mL via INTRAVENOUS

## 2020-01-15 MED ORDER — AMMONIA AROMATIC IN INHA
RESPIRATORY_TRACT | Status: AC
  Filled 2020-01-15: qty 10

## 2020-01-15 MED ORDER — LIDOCAINE HCL (PF) 1 % IJ SOLN
30.0000 mL | INTRAMUSCULAR | Status: DC | PRN
  Filled 2020-01-15: qty 30

## 2020-01-15 MED ORDER — LACTATED RINGERS IV SOLN
500.0000 mL | Freq: Once | INTRAVENOUS | Status: AC
  Administered 2020-01-15: 500 mL via INTRAVENOUS

## 2020-01-15 MED ORDER — FENTANYL CITRATE (PF) 100 MCG/2ML IJ SOLN
INTRAMUSCULAR | Status: AC
  Administered 2020-01-15: 100 ug via INTRAVENOUS
  Filled 2020-01-15: qty 2

## 2020-01-15 MED ORDER — MISOPROSTOL 25 MCG QUARTER TABLET
25.0000 ug | ORAL_TABLET | ORAL | Status: DC
  Administered 2020-01-15 (×2): 25 ug via BUCCAL
  Filled 2020-01-15 (×2): qty 1

## 2020-01-15 MED ORDER — TERBUTALINE SULFATE 1 MG/ML IJ SOLN: 0.2500 mg | Freq: Once | INTRAMUSCULAR | Status: DC | PRN

## 2020-01-15 MED ORDER — OXYTOCIN BOLUS FROM INFUSION
333.0000 mL | Freq: Once | INTRAVENOUS | Status: AC
  Administered 2020-01-16: 333 mL via INTRAVENOUS

## 2020-01-15 MED ORDER — LABETALOL HCL 200 MG PO TABS
200.0000 mg | ORAL_TABLET | Freq: Two times a day (BID) | ORAL | Status: DC
  Administered 2020-01-15 – 2020-01-18 (×7): 200 mg via ORAL
  Filled 2020-01-15 (×2): qty 1
  Filled 2020-01-15: qty 2
  Filled 2020-01-15 (×2): qty 1
  Filled 2020-01-15: qty 2
  Filled 2020-01-15: qty 1

## 2020-01-15 MED ORDER — FENTANYL 2.5 MCG/ML W/ROPIVACAINE 0.15% IN NS 100 ML EPIDURAL (ARMC)
12.0000 mL/h | EPIDURAL | Status: DC
  Administered 2020-01-16: 12 mL/h via EPIDURAL
  Filled 2020-01-15: qty 100

## 2020-01-15 MED ORDER — ONDANSETRON HCL 4 MG/2ML IJ SOLN
4.0000 mg | Freq: Four times a day (QID) | INTRAMUSCULAR | Status: DC | PRN
  Administered 2020-01-15 (×2): 4 mg via INTRAVENOUS
  Filled 2020-01-15 (×3): qty 2

## 2020-01-15 MED ORDER — ONDANSETRON HCL 4 MG/2ML IJ SOLN
4.0000 mg | Freq: Once | INTRAMUSCULAR | Status: AC | PRN
  Administered 2020-01-15: 4 mg via INTRAVENOUS

## 2020-01-15 MED ORDER — SODIUM CHLORIDE 0.9 % IV SOLN
INTRAVENOUS | Status: DC | PRN
  Administered 2020-01-15: 10 mL via EPIDURAL

## 2020-01-15 MED ORDER — SOD CITRATE-CITRIC ACID 500-334 MG/5ML PO SOLN
30.0000 mL | ORAL | Status: DC | PRN
  Administered 2020-01-15: 30 mL via ORAL
  Filled 2020-01-15: qty 15

## 2020-01-15 MED ORDER — OXYTOCIN 10 UNIT/ML IJ SOLN
INTRAMUSCULAR | Status: AC
  Filled 2020-01-15: qty 2

## 2020-01-15 MED ORDER — LACTATED RINGERS IV SOLN: INTRAVENOUS | Status: DC

## 2020-01-15 MED ORDER — OXYTOCIN-SODIUM CHLORIDE 30-0.9 UT/500ML-% IV SOLN
1.0000 m[IU]/min | INTRAVENOUS | Status: DC
  Administered 2020-01-15: 2 m[IU]/min via INTRAVENOUS
  Filled 2020-01-15: qty 500

## 2020-01-15 MED ORDER — FENTANYL CITRATE (PF) 100 MCG/2ML IJ SOLN: 100.0000 ug | Freq: Once | INTRAMUSCULAR | Status: AC

## 2020-01-15 MED ORDER — BUTORPHANOL TARTRATE 1 MG/ML IJ SOLN: 1.0000 mg | INTRAMUSCULAR | Status: DC | PRN

## 2020-01-15 MED ORDER — LIDOCAINE HCL (PF) 1 % IJ SOLN
INTRAMUSCULAR | Status: DC | PRN
  Administered 2020-01-15 (×2): 3 mL
  Administered 2020-01-15: 2 mL

## 2020-01-15 MED ORDER — MISOPROSTOL 25 MCG QUARTER TABLET
25.0000 ug | ORAL_TABLET | ORAL | Status: DC | PRN
  Administered 2020-01-15 (×2): 25 ug via VAGINAL
  Filled 2020-01-15 (×2): qty 1

## 2020-01-15 MED ORDER — EPHEDRINE 5 MG/ML INJ: 10.0000 mg | INTRAVENOUS | Status: DC | PRN

## 2020-01-15 MED ORDER — FENTANYL 2.5 MCG/ML W/ROPIVACAINE 0.15% IN NS 100 ML EPIDURAL (ARMC)
EPIDURAL | Status: DC | PRN
  Administered 2020-01-15: 12 mL/h via EPIDURAL

## 2020-01-15 MED ORDER — OXYTOCIN-SODIUM CHLORIDE 30-0.9 UT/500ML-% IV SOLN
2.5000 [IU]/h | INTRAVENOUS | Status: DC
  Filled 2020-01-15: qty 500

## 2020-01-15 MED ORDER — ACETAMINOPHEN 500 MG PO TABS: 1000.0000 mg | ORAL_TABLET | Freq: Four times a day (QID) | ORAL | Status: DC | PRN

## 2020-01-15 MED ORDER — MISOPROSTOL 200 MCG PO TABS
ORAL_TABLET | ORAL | Status: AC
  Filled 2020-01-15: qty 4

## 2020-01-15 MED ORDER — BUPIVACAINE HCL (PF) 0.5 % IJ SOLN
INTRAMUSCULAR | Status: DC | PRN
  Administered 2020-01-15: .5 mL via INTRATHECAL

## 2020-01-15 MED ORDER — DIPHENHYDRAMINE HCL 50 MG/ML IJ SOLN: 12.5000 mg | INTRAMUSCULAR | Status: DC | PRN

## 2020-01-15 MED ORDER — FENTANYL 2.5 MCG/ML W/ROPIVACAINE 0.15% IN NS 100 ML EPIDURAL (ARMC)
EPIDURAL | Status: AC
  Filled 2020-01-15: qty 100

## 2020-01-15 MED ORDER — LIDOCAINE-EPINEPHRINE (PF) 1.5 %-1:200000 IJ SOLN
INTRAMUSCULAR | Status: DC | PRN
  Administered 2020-01-15 (×2): 3 mL via EPIDURAL

## 2020-01-15 NOTE — Anesthesia Procedure Notes (Signed)
Epidural Patient location during procedure: OB Start time: 01/15/2020 7:23 PM End time: 01/15/2020 7:51 PM  Staffing Anesthesiologist: Corinda Gubler, MD Performed: anesthesiologist   Preanesthetic Checklist Completed: patient identified, IV checked, site marked, risks and benefits discussed, surgical consent, monitors and equipment checked, pre-op evaluation and timeout performed  Epidural Patient position: sitting Prep: ChloraPrep Patient monitoring: heart rate, continuous pulse ox and blood pressure Approach: midline Location: L2-L3 Injection technique: LOR saline  Needle:  Needle type: Tuohy  Needle gauge: 17 G Needle length: 9 cm and 9 Needle insertion depth: 8 cm Catheter type: closed end flexible Catheter size: 19 Gauge Catheter at skin depth: 13 cm Test dose: negative and 1.5% lidocaine with Epi 1:200 K  Assessment Sensory level: T10 Events: blood not aspirated, injection not painful, no injection resistance, no paresthesia and negative IV test  Additional Notes first attempt Pt. Evaluated and documentation done after procedure finished. Patient identified. Risks/Benefits/Options discussed with patient including but not limited to bleeding, infection, nerve damage, paralysis, failed block, incomplete pain control, headache, blood pressure changes, nausea, vomiting, reactions to medication both or allergic, itching and postpartum back pain. Confirmed with bedside nurse the patient's most recent platelet count. Confirmed with patient that they are not currently taking any anticoagulation, have any bleeding history or any family history of bleeding disorders. Patient expressed understanding and wished to proceed. All questions were answered. Sterile technique was used throughout the entire procedure. Please see nursing notes for vital signs. Test dose was given through epidural catheter and negative prior to continuing to dose epidural or start infusion. Warning signs of high  block given to the patient including shortness of breath, tingling/numbness in hands, complete motor block, or any concerning symptoms with instructions to call for help. Patient was given instructions on fall risk and not to get out of bed. All questions and concerns addressed with instructions to call with any issues or inadequate analgesia.   Patient tolerated the insertion well without immediate complications.Reason for block:procedure for pain

## 2020-01-15 NOTE — Progress Notes (Signed)
Late entry Intrapartum progress note  S: pt comfortable after 2nd epidural.  O: BP (!) 125/51   Pulse 83   Temp 98.7 F (37.1 C) (Oral)   Resp 18   Ht 5\' 3"  (1.6 m)   Wt 100.2 kg   LMP 04/21/2019 (Approximate)   SpO2 99%   BMI 39.15 kg/m    SVE: 5/90/-1 large stool burden in rectum TOCO: difficulty detecting; pitocin turned off by RN while I was in another delivery. FHT: 125 mod + accels, +variables and either lates or earlies, cannot discern due to contraction monitoring.  AROM for minimal clear fluid IUPC placed +incidental scalp stim  A/P: 19yo G1P0 @ 38+3 with IOL for chronic hypertension and gestational diabetes.    1. IUP: category 1, reassuring with mod variability and spontaneous accels. 2. IOL: s/p cytotec, foley bulb, and now pitocin.  Pitocin restarted after IUPC placement.  Will titrate to contraction pattern and now MVus  3. CHTN: no s/sx preeclampsia 4. Will continue active management.  ----- 06/19/2019, MD, FACOG Attending Obstetrician and Gynecologist [facility], Department of OB/GYN [facility]

## 2020-01-15 NOTE — Anesthesia Preprocedure Evaluation (Signed)
Anesthesia Evaluation  Patient identified by MRN, date of birth, ID band Patient awake    Reviewed: Allergy & Precautions, NPO status , Patient's Chart, lab work & pertinent test results  History of Anesthesia Complications Negative for: history of anesthetic complications  Airway Mallampati: III  TM Distance: >3 FB Neck ROM: Full    Dental no notable dental hx. (+) Teeth Intact   Pulmonary neg pulmonary ROS, neg sleep apnea, neg COPD, Patient abstained from smoking.Not current smoker,    Pulmonary exam normal breath sounds clear to auscultation       Cardiovascular Exercise Tolerance: Good METShypertension, Pt. on medications (-) CAD and (-) Past MI (-) dysrhythmias  Rhythm:Regular Rate:Normal - Systolic murmurs    Neuro/Psych PSYCHIATRIC DISORDERS Anxiety Depression negative neurological ROS     GI/Hepatic neg GERD  ,(+)     (-) substance abuse  ,   Endo/Other  diabetes, Gestational  Renal/GU negative Renal ROS     Musculoskeletal   Abdominal   Peds  Hematology   Anesthesia Other Findings Past Medical History: No date: Anxiety No date: Depression No date: Gestational diabetes No date: Hypertension No date: Pregnancy induced hypertension  Reproductive/Obstetrics (+) Pregnancy                             Anesthesia Physical Anesthesia Plan  ASA: II  Anesthesia Plan: Epidural   Post-op Pain Management:    Induction:   PONV Risk Score and Plan: 2 and Treatment may vary due to age or medical condition and Ondansetron  Airway Management Planned: Natural Airway  Additional Equipment:   Intra-op Plan:   Post-operative Plan:   Informed Consent: I have reviewed the patients History and Physical, chart, labs and discussed the procedure including the risks, benefits and alternatives for the proposed anesthesia with the patient or authorized representative who has indicated  his/her understanding and acceptance.       Plan Discussed with: Surgeon  Anesthesia Plan Comments: (Discussed R/B/A of neuraxial anesthesia technique with patient: - rare risks of spinal/epidural hematoma, nerve damage, infection - Risk of PDPH - Risk of itching - Risk of nausea and vomiting - Risk of poor block necessitating replacement of epidural. Patient voiced understanding.)        Anesthesia Quick Evaluation

## 2020-01-15 NOTE — H&P (Signed)
OB History & Physical   History of Present Illness:  Chief Complaint:   HPI:  Penny Wilson is a 19 y.o. G1P0 female at 41w2ddated by LMP of 04/21/19 . Estimated Date of Delivery: 01/26/20  She presents to L&D for induction of labor due to her co-morbidities.  +FM, no CTX, no LOF, no VB  Pregnancy Issues: 1. Chronic HTN - Currently on 2055mBID  2. GDMA1  3. Chronically elevated LFTs  4. Obesity - prepregnancy 40 5. Fetal drug exposure - UDS neg for THC on admission  6. Late transfer of care due to social issues 7. Rubella non-immune 8. Fetal renal pyelectasis - notify peds after delivery   Patient Active Problem List   Diagnosis Date Noted  . Labor and delivery indication for care or intervention 01/15/2020  . High-risk pregnancy in third trimester 01/07/2020  . Elevated blood pressure affecting pregnancy in third trimester, antepartum 01/06/2020  . Gestational diabetes 10/25/2019  . Steatosis 09/11/2019  . Marijuana use 08/30/2019  . Chronic hypertension affecting pregnancy 08/28/2019  . Rubella non-immune status, antepartum 07/23/2019  . Asymptomatic bacteriuria during pregnancy 07/23/2019  . Supervision of high risk pregnancy, antepartum 07/17/2019  . BMI 39.0-39.9,adult 07/17/2019  . Mild depression (HCXenia05/07/2019     Maternal Medical History:   Past Medical History:  Diagnosis Date  . Anxiety   . Depression   . Gestational diabetes   . Hypertension   . Pregnancy induced hypertension     Past Surgical History:  Procedure Laterality Date  . NO PAST SURGERIES      No Known Allergies  Prior to Admission medications   Medication Sig Start Date End Date Taking? Authorizing Provider  acetaminophen (TYLENOL) 325 MG tablet Take 650 mg by mouth every 6 (six) hours as needed.   Yes [provider]  labetalol (NORMODYNE) 200 MG tablet Take 3 tablets (600 mg total) by mouth 2 (two) times daily. Patient taking differently: Take 200 mg by mouth 2  (two) times daily.  11/20/19  Yes ShSerita Grammes, CNM  Accu-Chek Softclix Lancets lancets Use as instructed to check blood sugar 4 times daily 10/28/19   ShMyrtis SerCNM  aspirin 81 MG chewable tablet Chew 2 tablets (162 mg total) by mouth daily. Patient not taking: Reported on 01/15/2020 07/17/19   ShMyrtis SerCNM  Blood Glucose Monitoring Suppl (ACCU-CHEK GUIDE ME) w/Device KIT 1 each by Does not apply route 4 (four) times daily. 10/28/19   ShMyrtis SerCNM  Blood Pressure Monitor MISC For regular home bp monitoring during pregnancy 07/17/19   ShMyrtis SerCNM  glucose blood (ACCU-CHEK GUIDE) test strip Use as instructed to check blood sugar 4 times daily 10/28/19   ShMyrtis SerCNM  Prenatal Vit-Fe Fumarate-FA (PRENATAL VITAMIN PO) Take by mouth. Patient not taking: Reported on 01/15/2020    [provider]  promethazine (PHENERGAN) 25 MG tablet Take 1 tablet (25 mg total) by mouth every 6 (six) hours as needed for nausea or vomiting. Patient not taking: Reported on 01/15/2020 10/23/19   ShMyrtis SerCNM     P[facility] care site:  [facility] 3761 [facility]: She  reports that she has never smoked. She has never used smokeless tobacco. She reports current drug use. Drug: Marijuana. She reports that she does not drink alcohol.  Family History: family history includes Narcolepsy in her mother.  Review of Systems: A full review of systems was performed and negative except as noted in the HPI.     Physical Exam:  Vital Signs: BP 118/84   Pulse 100   Temp 98.7 F (37.1 C) (Oral)   Resp 18   Ht 5' 3"  (1.6 m)   Wt 100.2 kg   LMP 04/21/2019 (Approximate)   BMI 39.15 kg/m  Physical Exam  General: no acute distress.  HEENT: normocephalic, atraumatic Heart: regular rate & rhythm.  No murmurs/rubs/gallops Lungs: clear to auscultation bilaterally, normal respiratory effort Abdomen: soft,  gravid, non-tender;  EFW: 7 1/2 lbs  Pelvic:   External: Normal external female genitalia  Cervix: Dilation: Fingertip / Effacement (%): Thick / Station: -3    Extremities: non-tender, symmetric, 1+ edema bilaterally.  DTRs: 2+/2+  Neurologic: Alert & oriented x 3.    Results for orders placed or performed during the hospital encounter of 01/15/20 (from the past 24 hour(s))  CBC     Status: Abnormal   Collection Time: 01/15/20 12:39 AM  Result Value Ref Range   WBC 11.0 (H) 4.0 - 10.5 K/uL   RBC 3.85 (L) 3.87 - 5.11 MIL/uL   Hemoglobin 10.2 (L) 12.0 - 15.0 g/dL   HCT 31.1 (L) 36 - 46 %   MCV 80.8 80.0 - 100.0 fL   MCH 26.5 26.0 - 34.0 pg   MCHC 32.8 30.0 - 36.0 g/dL   RDW 14.7 11.5 - 15.5 %   Platelets 264 150 - 400 K/uL   nRBC 0.0 0.0 - 0.2 %  Comprehensive metabolic panel     Status: Abnormal   Collection Time: 01/15/20 12:39 AM  Result Value Ref Range   Sodium 136 135 - 145 mmol/L   Potassium 3.9 3.5 - 5.1 mmol/L   Chloride 105 98 - 111 mmol/L   CO2 22 22 - 32 mmol/L   Glucose, Bld 90 70 - 99 mg/dL   BUN 11 6 - 20 mg/dL   Creatinine, Ser 0.73 0.44 - 1.00 mg/dL   Calcium 9.4 8.9 - 10.3 mg/dL   Total Protein 6.1 (L) 6.5 - 8.1 g/dL   Albumin 2.6 (L) 3.5 - 5.0 g/dL   AST 18 15 - 41 U/L   ALT 19 0 - 44 U/L   Alkaline Phosphatase 136 (H) 38 - 126 U/L   Total Bilirubin 0.5 0.3 - 1.2 mg/dL   GFR, Estimated >60 >60 mL/min   Anion gap 9 5 - 15  Protein / creatinine ratio, urine     Status: Abnormal   Collection Time: 01/15/20 12:39 AM  Result Value Ref Range   Creatinine, Urine 213 mg/dL   Total Protein, Urine 62 mg/dL   Protein Creatinine Ratio 0.29 (H) 0.00 - 0.15 mg/mg[Cre]  Type and screen     Status: None   Collection Time: 01/15/20 12:39 AM  Result Value Ref Range   ABO/RH(D) O POS    Antibody Screen NEG    Sample Expiration      01/18/2020,2359 Performed at [facility], [address] [place], [place] [address]   Respiratory Panel by RT PCR (Flu  A&B, Covid) - Nasopharyngeal Swab     Status: None   Collection Time: 01/15/20 12:39 AM   Specimen: Nasopharyngeal Swab  Result Value Ref Range   SARS Coronavirus 2 by RT PCR NEGATIVE NEGATIVE   Influenza A by PCR NEGATIVE NEGATIVE   Influenza B by PCR NEGATIVE NEGATIVE  Urine Drug Screen, Qualitative ([facility] only)  Status: None   Collection Time: 01/15/20 12:39 AM  Result Value Ref Range   Tricyclic, Ur Screen NONE DETECTED NONE DETECTED   Amphetamines, Ur Screen NONE DETECTED NONE DETECTED   MDMA (Ecstasy)Ur Screen NONE DETECTED NONE DETECTED   Cocaine Metabolite,Ur [place] NONE DETECTED NONE DETECTED   Opiate, Ur Screen NONE DETECTED NONE DETECTED   Phencyclidine (PCP) Ur S NONE DETECTED NONE DETECTED   Cannabinoid 50 Ng, Ur [place] NONE DETECTED NONE DETECTED   Barbiturates, Ur Screen NONE DETECTED NONE DETECTED   Benzodiazepine, Ur Scrn NONE DETECTED NONE DETECTED   Methadone Scn, Ur NONE DETECTED NONE DETECTED  ABO/Rh     Status: None   Collection Time: 01/15/20  2:12 AM  Result Value Ref Range   ABO/RH(D)      O POS Performed at [facility], [address] [place], [place] [address]   Glucose, capillary     Status: None   Collection Time: 01/15/20  7:53 AM  Result Value Ref Range   Glucose-Capillary 87 70 - 99 mg/dL    Pertinent Results:  Prenatal Labs: Blood type/Rh O pos   Antibody screen neg  Rubella Immune  Varicella Pending   RPR NR  HBsAg Neg  HIV NR  GC neg  Chlamydia neg  Genetic screening negative  1 hour GTT  --  3 hour GTT 118-204-180  GBS Neg   FHT: Baseline: 130 bpm, Variability: moderate, Accelerations: present and Decelerations: Absent TOCO: irregular, every 1-3 minutes, mild to palpation  SVE:  Dilation: Fingertip / Effacement (%): Thick / Station: -3    Cephalic by [place] in office prior to admission   US OB Follow Up  Result Date: 12/20/2019 FOLLOW UP SONOGRAM Penny Wilson is in the office for a follow up sonogram for BPP,EFW and  cord dopplers. She is a 19 y.o. year old G1P0 with Estimated Date of Delivery: 01/26/20 by LMP now at  10w5dweeks gestation. Thus far the pregnancy has been complicated by CHTN,GDM A1. GESTATION: SINGLETON PRESENTATION: Transverse head left FETAL ACTIVITY:          Heart rate         137          The fetus is active. AMNIOTIC FLUID: The amniotic fluid volume is  normal, 15.6 cm. PLACENTA LOCALIZATION:  posterior GRADE 0 CERVIX: Measures 3.1 cm GESTATIONAL AGE AND  BIOMETRICS: Gestational criteria: Estimated Date of Delivery: 01/26/20 by LMP now at 327w5drevious Scans:4          BIPARIETAL DIAMETER           8.65 cm         34+6 weeks HEAD CIRCUMFERENCE           32.26 cm         36+3 weeks ABDOMINAL CIRCUMFERENCE           31.91 cm         35+5 weeks FEMUR LENGTH           6.61 cm         34 weeks                                                       AVERAGE EGA(BY THIS SCAN):  35+1 weeks  ESTIMATED FETAL WEIGHT:       2650  grams, 64 % BIOPHYSICAL PROFILE:                                                                                                      COMMENTS GROSS BODY MOVEMENT                 2  TONE                2  RESPIRATIONS                2  AMNIOTIC FLUID                2                                                          SCORE:  8/8 (Note: NST was not performed as part of this antepartum testing) DOPPLER FLOW STUDIES: UMBILICAL ARTERY RI RATIOS:   elevated UAD with end diastolic flow,RI .50,.09,.38=18% ANATOMICAL SURVEY                                                                            COMMENTS CEREBRAL VENTRICLES yes normal  CHOROID PLEXUS yes normal  CEREBELLUM yes normal                  NOSE/LIP yes normal  FACIAL PROFILE yes normal  4 CHAMBERED HEART yes normal  OUTFLOW TRACTS Limited view    DIAPHRAGM yes normal  STOMACH yes normal  RENAL REGION yes Abnormal  bilat renal pelvic dilation, left renal pelvis 9.1 mm,right renal pelvis  7.4 mm BLADDER yes normal  CORD INSERTION yes normal  3 VESSEL CORD yes normal              GENITALIA yes normal female     SUSPECTED ABNORMALITIES: bilat renal pelvic dilation, left renal pelvis 9.1 mm,right renal pelvis 7.4 mm elevated UAD with end diastolic flow 29% QUALITY OF SCAN: Limited view TECHNICIAN COMMENTS: [place] 34+5 wks,transverse head left,BPP 8/8,cx 3.1 cm,AFI 15.6 cm,fhr 137 bpm,bilat renal pelvic dilation, left renal pelvis 9.1 mm,right renal pelvis 7.4 mm,elevated UAD with end diastolic flow,RI .93,.71,.69=67%,ELF 2650 g 64% A copy of this report including all images has been saved and backed up to a second source for retrieval if needed. All measures and details of the anatomical scan, placentation, fluid volume and pelvic anatomy are contained in that report. Amber Heide Guile 12/20/2019 12:19 PM Clinical Impression and recommendations: I have reviewed the sonogram results above, combined with the patient's current clinical course, below  are my impressions and any appropriate recommendations for management based on the sonographic findings. 1.  G1P0 Estimated Date of Delivery: 01/26/20 by serial sonographic evaluations 2.  Fetal sonographic surveillance findings: a). Normal fluid volume b). Normal antepartum fetal assessment with BPP 8/8 c). Elevatedl fetal Doppler ratios with consistent diastolic flow  14% d). Normal growth percentile with appropriate interval growth 64% Stable right RPD 3.  Normal general sonographic findings Recommend continued prenatal evaluations and care based on this sonogram and as clinically indicated from the patient's clinical course. Mertie Clause Eure 12/20/2019 12:25 PM   US Fetal BPP W/O Non Stress  Result Date: 01/06/2020 FOLLOW UP SONOGRAM Penny Wilson is in the office for a follow up sonogram for BPP and cord dopplers. She is a 19 y.o. year old G1P0 with Estimated Date of Delivery: 01/26/20 by LMP now at  10w5dweeks gestation. Thus far the pregnancy has been  complicated by CHTN,GDM A1. GESTATION: SINGLETON PRESENTATION: cephalic FETAL ACTIVITY:          Heart rate         142          The fetus is active. AMNIOTIC FLUID: The amniotic fluid volume is  normal, 14.8 cm. PLACENTA LOCALIZATION:  posterior GRADE 3 CERVIX: Limited view GESTATIONAL AGE AND  BIOMETRICS: Gestational criteria: Estimated Date of Delivery: 01/26/20 by LMP now at 353w5drevious Scans:5 BIOPHYSICAL PROFILE:                                                                                                      [place]               2  TONE                2  RESPIRATIONS                2  AMNIOTIC FLUID                2                                                          SCORE:  8/8 (Note: NST was not performed as part of this antepartum testing) DOPPLER FLOW STUDIES: UMBILICAL ARTERY RI RATIOS:   .69,.69=93% ANATOMICAL SURVEY                                                                            COMMENTS CEREBRAL VENTRICLES yes normal  CHOROID PLEXUS yes normal  CEREBELLUM yes normal  CISTERNA MAGNA yes normal  FACIAL PROFILE yes normal  4 CHAMBERED HEART yes normal  OUTFLOW TRACTS yes normal  DIAPHRAGM yes normal  STOMACH yes normal  RENAL REGION yes  bilateral renal pelvic dilation,LK 9.5 mm,RK 8.6 mm BLADDER yes normal      3 VESSEL CORD yes normal              GENITALIA   female     SUSPECTED ABNORMALITIES:  bilateral renal pelvic dilation,LK 9.5 mm,RK 8.6 mm  RI .69,.69=93% QUALITY OF SCAN: satisfactory TECHNICIAN COMMENTS: [place] 51+0 wks,cephalic,posterior placenta gr 3,fhr 142 bpm,bilateral renal pelvic dilation,LK 9.5 mm,RK 8.6 mm,BPP 8/8,AFI 14.8 CM,RI .69,.69=93% A copy of this report including all images has been saved and backed up to a second source for retrieval if needed. All measures and details of the anatomical scan, placentation, fluid volume and pelvic anatomy are contained in that report. Amber Heide Guile 01/03/2020 12:25 PM Clinical Impression  and recommendations: I have reviewed the sonogram results above, combined with the patient's current clinical course, below are my impressions and any appropriate recommendations for management based on the sonographic findings. 1.  G1P0 Estimated Date of Delivery: 01/26/20 by serial sonographic evaluations 2.  Fetal sonographic surveillance findings: a). Normal fluid volume b). Normal antepartum fetal assessment with BPP 8/8 c). Elevated  fetal Doppler ratios with consistent diastolic flow  25% Stable bilateral mild RPD 3.  Normal general sonographic findings Recommend continued prenatal evaluations and care based on this sonogram and as clinically indicated from the patient's clinical course. Mertie Clause Eure 01/06/2020 7:01 AM   US Fetal BPP W/O Non Stress  Result Date: 12/20/2019 FOLLOW UP SONOGRAM Penny Wilson is in the office for a follow up sonogram for BPP,EFW and cord dopplers. She is a 19 y.o. year old G1P0 with Estimated Date of Delivery: 01/26/20 by LMP now at  28w5dweeks gestation. Thus far the pregnancy has been complicated by CHTN,GDM A1. GESTATION: SINGLETON PRESENTATION: Transverse head left FETAL ACTIVITY:          Heart rate         137          The fetus is active. AMNIOTIC FLUID: The amniotic fluid volume is  normal, 15.6 cm. PLACENTA LOCALIZATION:  posterior GRADE 0 CERVIX: Measures 3.1 cm GESTATIONAL AGE AND  BIOMETRICS: Gestational criteria: Estimated Date of Delivery: 01/26/20 by LMP now at 345w5drevious Scans:4          BIPARIETAL DIAMETER           8.65 cm         34+6 weeks HEAD CIRCUMFERENCE           32.26 cm         36+3 weeks ABDOMINAL CIRCUMFERENCE           31.91 cm         35+5 weeks FEMUR LENGTH           6.61 cm         34 weeks                                                       AVERAGE EGA(BY THIS SCAN):  35+1 weeks  ESTIMATED FETAL WEIGHT:       2650  grams, 64 % BIOPHYSICAL PROFILE:                                                                                                       COMMENTS GROSS BODY MOVEMENT                 2  TONE                2  RESPIRATIONS                2  AMNIOTIC FLUID                2                                                          SCORE:  8/8 (Note: NST was not performed as part of this antepartum testing) DOPPLER FLOW STUDIES: UMBILICAL ARTERY RI RATIOS:   elevated UAD with end diastolic flow,RI .26,.20,.35=59% ANATOMICAL SURVEY                                                                            COMMENTS CEREBRAL VENTRICLES yes normal  CHOROID PLEXUS yes normal  CEREBELLUM yes normal                  NOSE/LIP yes normal  FACIAL PROFILE yes normal  4 CHAMBERED HEART yes normal  OUTFLOW TRACTS Limited view    DIAPHRAGM yes normal  STOMACH yes normal  RENAL REGION yes Abnormal  bilat renal pelvic dilation, left renal pelvis 9.1 mm,right renal pelvis 7.4 mm BLADDER yes normal  CORD INSERTION yes normal  3 VESSEL CORD yes normal              GENITALIA yes normal female     SUSPECTED ABNORMALITIES: bilat renal pelvic dilation, left renal pelvis 9.1 mm,right renal pelvis 7.4 mm elevated UAD with end diastolic flow 74% QUALITY OF SCAN: Limited view TECHNICIAN COMMENTS: [place] 34+5 wks,transverse head left,BPP 8/8,cx 3.1 cm,AFI 15.6 cm,fhr 137 bpm,bilat renal pelvic dilation, left renal pelvis 9.1 mm,right renal pelvis 7.4 mm,elevated UAD with end diastolic flow,RI .16,.38,.45=36%,IWO 2650 g 64% A copy of this report including all images has been saved and backed up to a second source for retrieval if needed. All measures and details of the anatomical scan, placentation, fluid volume and pelvic anatomy are contained in that report. Amber Heide Guile 12/20/2019 12:19 PM Clinical Impression and recommendations: I have reviewed the sonogram results above, combined with the patient's current clinical course, below  are my impressions and any appropriate recommendations for management based on the sonographic  findings. 1.  G1P0 Estimated Date of Delivery: 01/26/20 by serial sonographic evaluations 2.  Fetal sonographic surveillance findings: a). Normal fluid volume b). Normal antepartum fetal assessment with BPP 8/8 c). Elevatedl fetal Doppler ratios with consistent diastolic flow  56% d). Normal growth percentile with appropriate interval growth 64% Stable right RPD 3.  Normal general sonographic findings Recommend continued prenatal evaluations and care based on this sonogram and as clinically indicated from the patient's clinical course. Mertie Clause Eure 12/20/2019 12:25 PM   [place] UA Cord Doppler  Result Date: 01/06/2020 FOLLOW UP SONOGRAM Penny Wilson is in the office for a follow up sonogram for BPP and cord dopplers. She is a 19 y.o. year old G1P0 with Estimated Date of Delivery: 01/26/20 by LMP now at  38w5dweeks gestation. Thus far the pregnancy has been complicated by CHTN,GDM A1. GESTATION: SINGLETON PRESENTATION: cephalic FETAL ACTIVITY:          Heart rate         142          The fetus is active. AMNIOTIC FLUID: The amniotic fluid volume is  normal, 14.8 cm. PLACENTA LOCALIZATION:  posterior GRADE 3 CERVIX: Limited view GESTATIONAL AGE AND  BIOMETRICS: Gestational criteria: Estimated Date of Delivery: 01/26/20 by LMP now at 340w5drevious Scans:5 BIOPHYSICAL PROFILE:                                                                                                      [place]               2  TONE                2  RESPIRATIONS                2  AMNIOTIC FLUID                2                                                          SCORE:  8/8 (Note: NST was not performed as part of this antepartum testing) DOPPLER FLOW STUDIES: UMBILICAL ARTERY RI RATIOS:   .69,.69=93% ANATOMICAL SURVEY                                                                            COMMENTS CEREBRAL VENTRICLES yes normal  CHOROID PLEXUS yes normal  CEREBELLUM yes normal  CISTERNA MAGNA yes normal  FACIAL PROFILE yes normal  4 CHAMBERED HEART yes normal  OUTFLOW TRACTS yes normal  DIAPHRAGM yes normal  STOMACH yes normal  RENAL REGION yes  bilateral renal pelvic dilation,LK 9.5 mm,RK 8.6 mm BLADDER yes normal      3 VESSEL CORD yes normal              GENITALIA   female     SUSPECTED ABNORMALITIES:  bilateral renal pelvic dilation,LK 9.5 mm,RK 8.6 mm  RI .69,.69=93% QUALITY OF SCAN: satisfactory TECHNICIAN COMMENTS: [place] 46+5 wks,cephalic,posterior placenta gr 3,fhr 142 bpm,bilateral renal pelvic dilation,LK 9.5 mm,RK 8.6 mm,BPP 8/8,AFI 14.8 CM,RI .69,.69=93% A copy of this report including all images has been saved and backed up to a second source for retrieval if needed. All measures and details of the anatomical scan, placentation, fluid volume and pelvic anatomy are contained in that report. Amber Heide Guile 01/03/2020 12:25 PM Clinical Impression and recommendations: I have reviewed the sonogram results above, combined with the patient's current clinical course, below are my impressions and any appropriate recommendations for management based on the sonographic findings. 1.  G1P0 Estimated Date of Delivery: 01/26/20 by serial sonographic evaluations 2.  Fetal sonographic surveillance findings: a). Normal fluid volume b). Normal antepartum fetal assessment with BPP 8/8 c). Elevated  fetal Doppler ratios with consistent diastolic flow  68% Stable bilateral mild RPD 3.  Normal general sonographic findings Recommend continued prenatal evaluations and care based on this sonogram and as clinically indicated from the patient's clinical course. Mertie Clause Eure 01/06/2020 7:01 AM   [place] UA Cord Doppler  Result Date: 12/20/2019 FOLLOW UP SONOGRAM Penny Wilson is in the office for a follow up sonogram for BPP,EFW and cord dopplers. She is a 19 y.o. year old G1P0 with Estimated Date of Delivery: 01/26/20 by LMP now at  38w5dweeks gestation. Thus far the pregnancy has been complicated by CHTN,GDM A1. GESTATION:  SINGLETON PRESENTATION: Transverse head left FETAL ACTIVITY:          Heart rate         137          The fetus is active. AMNIOTIC FLUID: The amniotic fluid volume is  normal, 15.6 cm. PLACENTA LOCALIZATION:  posterior GRADE 0 CERVIX: Measures 3.1 cm GESTATIONAL AGE AND  BIOMETRICS: Gestational criteria: Estimated Date of Delivery: 01/26/20 by LMP now at 350w5drevious Scans:4          BIPARIETAL DIAMETER           8.65 cm         34+6 weeks HEAD CIRCUMFERENCE           32.26 cm         36+3 weeks ABDOMINAL CIRCUMFERENCE           31.91 cm         35+5 weeks FEMUR LENGTH           6.61 cm         34 weeks                                                       AVERAGE EGA(BY THIS SCAN):  35+1 weeks  ESTIMATED FETAL WEIGHT:       2650  grams, 64 % BIOPHYSICAL PROFILE:                                                                                                      COMMENTS GROSS BODY MOVEMENT                 2  TONE                2  RESPIRATIONS                2  AMNIOTIC FLUID                2                                                          SCORE:  8/8 (Note: NST was not performed as part of this antepartum testing) DOPPLER FLOW STUDIES: UMBILICAL ARTERY RI RATIOS:   elevated UAD with end diastolic flow,RI .27,.07,.86=75% ANATOMICAL SURVEY                                                                            COMMENTS CEREBRAL VENTRICLES yes normal  CHOROID PLEXUS yes normal  CEREBELLUM yes normal                  NOSE/LIP yes normal  FACIAL PROFILE yes normal  4 CHAMBERED HEART yes normal  OUTFLOW TRACTS Limited view    DIAPHRAGM yes normal  STOMACH yes normal  RENAL REGION yes Abnormal  bilat renal pelvic dilation, left renal pelvis 9.1 mm,right renal pelvis 7.4 mm BLADDER yes normal  CORD INSERTION yes normal  3 VESSEL CORD yes normal              GENITALIA yes normal female     SUSPECTED ABNORMALITIES: bilat renal pelvic dilation, left renal pelvis 9.1  mm,right renal pelvis 7.4 mm elevated UAD with end diastolic flow 44% QUALITY OF SCAN: Limited view TECHNICIAN COMMENTS: [place] 34+5 wks,transverse head left,BPP 8/8,cx 3.1 cm,AFI 15.6 cm,fhr 137 bpm,bilat renal pelvic dilation, left renal pelvis 9.1 mm,right renal pelvis 7.4 mm,elevated UAD with end diastolic flow,RI .92,.01,.00=71%,QRF 2650 g 64% A copy of this report including all images has been saved and backed up to a second source for retrieval if needed. All measures and details of the anatomical scan, placentation, fluid volume and pelvic anatomy are contained in that report. Amber Heide Guile 12/20/2019 12:19 PM Clinical Impression and recommendations: I have reviewed the sonogram results above, combined with the patient's current clinical course, below  are my impressions and any appropriate recommendations for management based on the sonographic findings. 1.  G1P0 Estimated Date of Delivery: 01/26/20 by serial sonographic evaluations 2.  Fetal sonographic surveillance findings: a). Normal fluid volume b). Normal antepartum fetal assessment with BPP 8/8 c). Elevatedl fetal Doppler ratios with consistent diastolic flow  65% d). Normal growth percentile with appropriate interval growth 64% Stable right RPD 3.  Normal general sonographic findings Recommend continued prenatal evaluations and care based on this sonogram and as clinically indicated from the patient's clinical course. Florian Buff 12/20/2019 12:25 PM    Assessment:  Penny Wilson is a 19 y.o. G1P0 female at 51w3dwith chronic HTN and A1GDM.   Plan:  1. Admit to Labor & Delivery; consents reviewed and obtained - Covid admission screen  - Dr. WLeonides Schanznotified of admission   2. Fetal Well being  - Fetal Tracing: Cat 1 - Group B Streptococcus ppx not indicated: GBS neg - Presentation: cephalic confirmed by [place] prior to admission    3. High risk OB: - Prenatal labs reviewed, as above - Rh pos - CBC, T&S, RPR on admit - Carb modified  Gestational diet, saline lock - CBG q 4 hours, notify provider for CBG > 120  4. Monitoring of labor  -  Contractions monitored with external toco -  Pelvis adequate for trial of labor  -  Plan for induction with misoprostol.  Will start oxytocin when cervix is favorable -  AROM when appropriate  -  Plan for  continuous fetal monitoring -  Maternal pain control as desired - Anticipate vaginal delivery  5. Post Partum Planning: - Infant feeding: breast - Contraception: TBD - Tdap vaccine: declined  - Flu vaccine: due for current season   AMinda Meo CNorth Dakota11/03/21 8:53 AM  ADrinda Butts CNM Certified Nurse Midwife [facility]

## 2020-01-15 NOTE — Progress Notes (Signed)
Labor Progress Note  Penny Wilson is a 19 y.o. G1P0 at [redacted]w[redacted]d by LMP admitted for induction of labor due to Gestational diabetes and chronic HTN.  Subjective: reports cramping and contractions   Objective: BP 123/83   Pulse (!) 107   Temp 98.5 F (36.9 C) (Oral)   Resp 18   Ht 5\' 3"  (1.6 m)   Wt 100.2 kg   LMP 04/21/2019 (Approximate)   BMI 39.15 kg/m  Notable VS details: reviewed   Fetal Assessment: FHT:  FHR: 130 bpm, variability: moderate,  accelerations:  Present,  decelerations:  Present intermittent lates Category/reactivity:  Category II UC:   irregular, every 1-5 minutes, mild to palpation  SVE:   1/60/-2/med/ant Membrane status: Intact Amniotic color: N/A  Labs: Lab Results  Component Value Date   WBC 11.0 (H) 01/15/2020   HGB 10.2 (L) 01/15/2020   HCT 31.1 (L) 01/15/2020   MCV 80.8 01/15/2020   PLT 264 01/15/2020    Assessment / Plan: Induction of labor d/t A1GDM and Chronic HTN.  S/P 2 doses of buccal and vaginal misoprostol. Cervical balloon placed, premedicated with IVPM.  Tolerated placement well.  Will start oxytocin for continued active management of induction of labor. Dr. 13/05/2019 updated on plan of care and fetal tracing.    Labor: Early labor.  Unable to continue with misoprostol at this time d/t tachysystole and intermittent late decelerations.   Preeclampsia:  no s/s Fetal Wellbeing:  Category II - overall reassuring with moderate variability and accels.  Lates resolve with minimal to no intervention.   Pain Control:  IV pain meds I/D:  n/a   Elesa Massed, CNM 01/15/2020, 1:39 PM

## 2020-01-15 NOTE — Anesthesia Procedure Notes (Signed)
Epidural Patient location during procedure: OB Start time: 01/15/2020 9:10 PM End time: 01/15/2020 9:26 PM  Staffing Anesthesiologist: Corinda Gubler, MD Performed: anesthesiologist   Preanesthetic Checklist Completed: patient identified, IV checked, site marked, risks and benefits discussed, surgical consent, monitors and equipment checked, pre-op evaluation and timeout performed  Epidural Patient position: sitting Prep: ChloraPrep Patient monitoring: heart rate, continuous pulse ox and blood pressure Approach: midline Location: L3-L4 Injection technique: LOR saline  Needle:  Needle type: Tuohy  Needle gauge: 17 G Needle length: 9 cm and 9 Needle insertion depth: 8 cm Catheter type: closed end flexible Catheter size: 19 Gauge Catheter at skin depth: 13 cm Test dose: negative and 1.5% lidocaine with Epi 1:200 K  Assessment Sensory level: T10 Events: blood not aspirated, injection not painful, no injection resistance, no paresthesia and negative IV test  Additional Notes second attempt, due to unilateral block of prior epidural. CSE PERFORMED  Patient identified. Risks/Benefits/Options discussed with patient including but not limited to bleeding, infection, nerve damage, paralysis, failed block, incomplete pain control, headache, blood pressure changes, nausea, vomiting, reactions to medication both or allergic, itching and postpartum back pain. Confirmed with bedside nurse the patient's most recent platelet count. Confirmed with patient that they are not currently taking any anticoagulation, have any bleeding history or any family history of bleeding disorders. Patient expressed understanding and wished to proceed. All questions were answered. Sterile technique was used throughout the entire procedure. Please see nursing notes for vital signs. Upon loss of resistance to saline via Tuohy needle, a 25g spinal needle was passed through it, clear CSF return with no paresthesias, and  2.5mg  of 0.5% preservative-free bupivacaine was injected intrathecally. Epidural catheter was threaded without issue.Test dose was given through epidural catheter and negative prior to continuing to dose epidural or start infusion. Warning signs of high block given to the patient including shortness of breath, tingling/numbness in hands, complete motor block, or any concerning symptoms with instructions to call for help. Patient was given instructions on fall risk and not to get out of bed. All questions and concerns addressed with instructions to call with any issues or inadequate analgesia.   Patient tolerated the insertion well without immediate complications.Reason for block:procedure for pain

## 2020-01-15 NOTE — Progress Notes (Signed)
Labor Progress Note  Penny Wilson is a 19 y.o. G1P0 at [redacted]w[redacted]d by LMP admitted for induction of labor due to Gestational diabetes and Hypertension.  Subjective: Feeling more uncomfortable with contractions.  Breathing well with them.  Family is supportive at bedside.   Objective: BP 136/88 (BP Location: Right Arm)   Pulse 74   Temp 98.5 F (36.9 C) (Oral)   Resp 20   Ht 5\' 3"  (1.6 m)   Wt 100.2 kg   LMP 04/21/2019 (Approximate)   BMI 39.15 kg/m  Notable VS details: reviewed - no severe range b/p's   Fetal Assessment: FHT:  FHR: 130 bpm, variability: moderate,  accelerations:  Present,  decelerations:  Present intermittent lates Category/reactivity:  Category II UC:   regular, every 1-3 minutes SVE:  3/70/-2/soft/ant  Membrane status: Intact Amniotic color: N/A  Labs: Lab Results  Component Value Date   WBC 11.0 (H) 01/15/2020   HGB 10.2 (L) 01/15/2020   HCT 31.1 (L) 01/15/2020   MCV 80.8 01/15/2020   PLT 264 01/15/2020    Assessment / Plan: Induction of labor due to gestational diabetes and chronic hypertension,  progressing well on pitocin  -Pitocin currently infusing at 12 milliunits/minute  -Dr. 13/05/2019 updated on progress  Labor: Progressing on Pitocin, will continue to increase then AROM.  Will get epidural first prior to AROM d/t discomfort with pelvic exams.   Preeclampsia:  no s/s, b/p's remain withing normal to mild range Fetal Wellbeing:  Category II - intermittent lates that resolve with position changes, moderate variability present.  Difficulty monitoring contractions on side.  Will consider internal monitors after AROM.   Pain Control:  Labor support without medications and requesting epidural I/D:  n/a   Elesa Massed, CNM 01/15/2020, 6:50 PM

## 2020-01-15 NOTE — Progress Notes (Signed)
Inpatient Diabetes Program Recommendations  Diabetes Treatment Program Recommendations  ADA Standards of Care Diabetes in Pregnancy Target Glucose Ranges:  Fasting: 60 - 90 mg/dL Preprandial: 60 - 403 mg/dL 1 hr postprandial: Less than 140mg /dL (from first bite of meal) 2 hr postprandial: Less than 120 mg/dL (from first bite of meal)   Results for Penny Wilson, Penny Wilson (MRN Daryl Eastern) as of 01/15/2020 11:38  Ref. Range 01/15/2020 07:53 01/15/2020 11:08  Glucose-Capillary Latest Ref Range: 70 - 99 mg/dL 87 98  Results for Penny Wilson, Penny Wilson (MRN Daryl Eastern) as of 01/15/2020 11:38  Ref. Range 01/15/2020 00:39  Glucose Latest Ref Range: 70 - 99 mg/dL 90   Review of Glycemic Control  Diabetes history: GDM; [redacted]W[redacted]D gestation Outpatient Diabetes medications: None; diet controlled Current orders for Inpatient glycemic control: None; CBGs Q4H  NOTE: Noted consult for diabetes coordinator. Chart reviewed. Noted patient has GDM hx which is diet controlled. Patient admitted for induction and glucose has ranged from 87-98 mg/dl since arrival to the hospital. Agree with continued glucose monitoring at this time. Will continue to follow along while inpatient.  Thanks, 13/05/2019, RN, MSN, CDE Diabetes Coordinator Inpatient Diabetes Program [phone] (Team Pager from 8am to 5pm)

## 2020-01-16 ENCOUNTER — Encounter: Payer: Self-pay | Admitting: Obstetrics & Gynecology

## 2020-01-16 LAB — GLUCOSE, CAPILLARY: Glucose-Capillary: 96 mg/dL (ref 70–99)

## 2020-01-16 MED ORDER — PRENATAL MULTIVITAMIN CH
1.0000 | ORAL_TABLET | Freq: Every day | ORAL | Status: DC
  Administered 2020-01-16 – 2020-01-18 (×3): 1 via ORAL
  Filled 2020-01-16 (×3): qty 1

## 2020-01-16 MED ORDER — INFLUENZA VAC A&B SA ADJ QUAD 0.5 ML IM PRSY
0.5000 mL | PREFILLED_SYRINGE | INTRAMUSCULAR | Status: DC
  Filled 2020-01-16: qty 0.5

## 2020-01-16 MED ORDER — COCONUT OIL OIL: 1.0000 "application " | TOPICAL_OIL | Status: DC | PRN

## 2020-01-16 MED ORDER — ONDANSETRON HCL 4 MG/2ML IJ SOLN: 4.0000 mg | INTRAMUSCULAR | Status: DC | PRN

## 2020-01-16 MED ORDER — ONDANSETRON HCL 4 MG PO TABS
4.0000 mg | ORAL_TABLET | ORAL | Status: DC | PRN
  Filled 2020-01-16: qty 1

## 2020-01-16 MED ORDER — ACETAMINOPHEN 500 MG PO TABS
1000.0000 mg | ORAL_TABLET | Freq: Four times a day (QID) | ORAL | Status: DC | PRN
  Administered 2020-01-16 – 2020-01-17 (×3): 1000 mg via ORAL
  Filled 2020-01-16 (×3): qty 2

## 2020-01-16 MED ORDER — BENZOCAINE-MENTHOL 20-0.5 % EX AERO
1.0000 "application " | INHALATION_SPRAY | CUTANEOUS | Status: DC | PRN
  Administered 2020-01-16: 1 via TOPICAL
  Filled 2020-01-16: qty 56

## 2020-01-16 MED ORDER — SIMETHICONE 80 MG PO CHEW
80.0000 mg | CHEWABLE_TABLET | ORAL | Status: DC | PRN
  Administered 2020-01-18: 80 mg via ORAL
  Filled 2020-01-16: qty 1

## 2020-01-16 MED ORDER — DOCUSATE SODIUM 100 MG PO CAPS
100.0000 mg | ORAL_CAPSULE | Freq: Two times a day (BID) | ORAL | Status: DC
  Administered 2020-01-16 – 2020-01-18 (×4): 100 mg via ORAL
  Filled 2020-01-16 (×4): qty 1

## 2020-01-16 MED ORDER — DIBUCAINE (PERIANAL) 1 % EX OINT: 1.0000 "application " | TOPICAL_OINTMENT | CUTANEOUS | Status: DC | PRN

## 2020-01-16 MED ORDER — WITCH HAZEL-GLYCERIN EX PADS: 1.0000 "application " | MEDICATED_PAD | CUTANEOUS | Status: DC

## 2020-01-16 MED ORDER — DIPHENHYDRAMINE HCL 25 MG PO CAPS: 25.0000 mg | ORAL_CAPSULE | Freq: Four times a day (QID) | ORAL | Status: DC | PRN

## 2020-01-16 MED ORDER — MEASLES, MUMPS & RUBELLA VAC IJ SOLR
0.5000 mL | Freq: Once | INTRAMUSCULAR | Status: DC
  Filled 2020-01-16: qty 0.5

## 2020-01-16 MED ORDER — IBUPROFEN 600 MG PO TABS
600.0000 mg | ORAL_TABLET | Freq: Four times a day (QID) | ORAL | Status: DC
  Administered 2020-01-16 – 2020-01-17 (×6): 600 mg via ORAL
  Filled 2020-01-16 (×6): qty 1

## 2020-01-16 MED ORDER — MEDROXYPROGESTERONE ACETATE 150 MG/ML IM SUSP
150.0000 mg | INTRAMUSCULAR | Status: DC | PRN
  Filled 2020-01-16: qty 1

## 2020-01-16 MED ORDER — OXYTOCIN-SODIUM CHLORIDE 30-0.9 UT/500ML-% IV SOLN
INTRAVENOUS | Status: AC
  Filled 2020-01-16: qty 500

## 2020-01-16 NOTE — Discharge Summary (Signed)
Obstetrical Discharge Summary  Patient Name: Penny Wilson DOB: [birth year] MRN: [id number]  Date of Admission: 01/15/2020 Date of Delivery: 01/16/2020 Delivered by: Larey Days, MD Date of Discharge: 01/18/2020  Primary OB: [facility]  DIY:MEBRAXE'N last menstrual period was 04/21/2019 (approximate). EDC Estimated Date of Delivery: 01/26/20 Gestational Age at Delivery: [redacted]w[redacted]d  Antepartum complications:   1. Chronic HTN 2. GDMA1  3. Chronically elevated LFTs  4. Obesity - prepregnancy 40 5. 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 Late transfer of care due to social issues 7. Rubella non-immune 8. Fetal renal pyelectasis   Admitting Diagnosis: induction of labor for chronic hypertension and GDMA1.  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   Labor and delivery indication for care or intervention 01/15/2020   High-risk pregnancy in third trimester 01/07/2020   Elevated blood pressure affecting pregnancy in third trimester, antepartum 01/06/2020   Gestational diabetes 10/25/2019   Steatosis 09/11/2019   Marijuana use 08/30/2019   Chronic hypertension affecting pregnancy 08/28/2019   Rubella non-immune status, antepartum 07/23/2019   Asymptomatic bacteriuria during pregnancy 07/23/2019   Supervision of high risk pregnancy, antepartum 07/17/2019   BMI 39.0-39.9,adult 07/17/2019   Mild depression ([place] 07/17/2019    Augmentation: AROM, Pitocin, Cytotec and IP Foley Complications: None Intrapartum complications/course: Mom presented to L&D with IOL for chronic hypertension and gestational diabetes.  She was given cytotec with a foley balloon, and further augmented with pitocin. epidual placed, and replaced for pain relief.  AROM'd for clear fluid, and IUPC placed for contraction monitoring.  Progressed to complete, second stage: <1hr, with delivery of fetal Wilson, reduction of nuchal cord  at the perineum, and restitution to LOT.   Anterior then posterior shoulders delivered without difficulty.  Baby placed on mom's chest, and attended to by peds.  Cord was then clamped and cut when pulseless.  Placenta spontaneously delivered, intact.   IV pitocin given for hemorrhage prophylaxis. 1st degree laceration repaired with 3-0 vicryl. Delivery Type: spontaneous vaginal delivery Anesthesia: epidural Placenta: spontaneous Laceration: 1st degree Episiotomy: none Newborn Data: Live born female "[employer]line [employer]ure Birth Weight: 6#0  APGAR: 8, 9  Newborn Delivery   Birth date/time: 01/16/2020 03:32:00 Delivery type: Vaginal, Spontaneous      Postpartum Procedures: none  Edinburgh:  Edinburgh Postnatal Depression Scale Screening Tool 01/17/2020 01/16/2020  I have been able to laugh and see the funny side of things. 1 (No Data)  I have looked forward with enjoyment to things. 0 -  I have blamed myself unnecessarily when things went wrong. 3 -  I have been anxious or worried for no good reason. 2 -  I have felt scared or panicky for no good reason. 2 -  Things have been getting on top of me. 1 -  I have been so unhappy that I have had difficulty sleeping. 0 -  I have felt sad or miserable. 1 -  I have been so unhappy that I have been crying. 0 -  The thought of harming myself has occurred to me. 0 -  Edinburgh Postnatal Depression Scale Total 10 -     Post partum course:  Patient had an uncomplicated postpartum course.  By time of discharge on PPD#2, her pain was controlled on oral pain medications; she had appropriate lochia and was ambulating, voiding without difficulty and tolerating regular diet. BP continued to be mildly elevated on labetalol 2067mPO BID, adding procardia 3076mL on day  of discharge.  She was deemed stable for discharge to home.     Discharge Physical Exam:  BP (!) 119/91 (BP Location: Right Arm)    Pulse 86    Temp 98.7 F (37.1 C) (Oral)    Resp 18    Ht 5' 3"  (1.6  m)    Wt 100.2 kg    LMP 04/21/2019 (Approximate)    SpO2 99%    Breastfeeding Unknown    BMI 39.15 kg/m   General: NAD CV: RRR Pulm: CTABL, nl effort ABD: s/nd/nt, fundus firm and below the umbilicus Lochia: moderate DVT Evaluation: LE non-ttp, no evidence of DVT on exam.  Hemoglobin  Date Value Ref Range Status  01/17/2020 9.2 (L) 12.0 - 15.0 g/dL Final  10/23/2019 11.9 11.1 - 15.9 g/dL Final   HCT  Date Value Ref Range Status  01/17/2020 28.9 (L) 36 - 46 % Final   Hematocrit  Date Value Ref Range Status  10/23/2019 34.7 34.0 - 46.6 % Final     Disposition: stable, discharge to home. Baby Feeding: formula Baby Disposition: home with mom  Rh Immune globulin given: n/a Rubella vaccine given: PPoffered prior to discharge Flu vaccine given in AP or PP setting: PP, offered prior to DC  Contraception: considering nexplanon  Prenatal Labs:   Blood type/Rh O pos   Antibody screen neg  Rubella Immune  Varicella immune   RPR NR  HBsAg Neg  HIV NR  GC neg  Chlamydia neg  Genetic screening negative  1 hour GTT  --  2 hour GTT 118-204-180  GBS Neg     Plan:  HATSUMI STEINHART was discharged to home in good condition. Follow-up appointment with delivering provider in 1 weeks.  Discharge Medications: Allergies as of 01/18/2020   No Known Allergies     Medication List    STOP taking these medications   Accu-Chek Guide Me w/Device Kit   Accu-Chek Guide test strip Generic drug: glucose blood   Accu-Chek Softclix Lancets lancets   aspirin 81 MG chewable tablet   promethazine 25 MG tablet Commonly known as: PHENERGAN     TAKE these medications   acetaminophen 500 MG tablet Commonly known as: TYLENOL Take 2 tablets (1,000 mg total) by mouth every 6 (six) hours as needed (for pain scale < 4). What changed:   medication strength  how much to take  reasons to take this   Blood Pressure Monitor Misc For regular home bp monitoring during pregnancy    docusate sodium 100 MG capsule Commonly known as: COLACE Take 1 capsule (100 mg total) by mouth 2 (two) times daily.   ibuprofen 600 MG tablet Commonly known as: ADVIL Take 1 tablet (600 mg total) by mouth every 6 (six) hours.   labetalol 200 MG tablet Commonly known as: NORMODYNE Take 1 tablet (200 mg total) by mouth 2 (two) times daily.   NIFEdipine 30 MG 24 hr tablet Commonly known as: ADALAT CC Take 1 tablet (30 mg total) by mouth daily.   PRENATAL VITAMIN PO Take by mouth.        Follow-up Information    Ward, Honor Loh, MD. Schedule an appointment as soon as possible for a visit in 1 week(s).   Specialty: Obstetrics and Gynecology Why: mood and BP check Contact information: [address] [place] [address] [phone]               Signed: Francetta Found, CNM 01/18/2020 11:42 AM

## 2020-01-16 NOTE — Lactation Note (Signed)
This note was copied from a baby's chart. Lactation Consultation Note  Patient Name: Penny Wilson Date: 01/16/2020 Reason for consult: Follow-up assessment  Lactation assisted with latch on the left in football hold. Baby latched well after a couple of attempts. He has a rhythmic sucking pattern with swallows noted. Mother was then set up with Symphony pump and has a Medela at home.Mother was falling asleep during pump session and LC held R flange at breast. Placed colostrum in vial to save for next feed. Encouraged breastfeed for a max of 20 minutes and then pump and bottle feed 22cal formula. Did have WIC in [place], trying to switch to [place].    Feeding Feeding Type: Breast Fed (also bottle fed formula after breast)  LATCH Score Latch: Repeated attempts needed to sustain latch, nipple held in mouth throughout feeding, stimulation needed to elicit sucking reflex.  Audible Swallowing: A few with stimulation  Type of Nipple: Everted at rest and after stimulation  Comfort (Breast/Nipple): Soft / non-tender  Hold (Positioning): Assistance needed to correctly position infant at breast and maintain latch.  LATCH Score: 7  Interventions Interventions: Breast feeding basics reviewed;Assisted with latch;Breast massage;Hand express;Breast compression;Adjust position;Support pillows;DEBP  Lactation Tools Discussed/Used Pump Review: Setup, frequency, and cleaning;Milk Storage Initiated by:: Daevion Navarette, IBCLC Date initiated:: 01/16/20   Consult Status Consult Status: Follow-up Date: 01/17/20 Follow-up type: Call as needed    Penny Wilson 01/16/2020, 4:32 PM

## 2020-01-16 NOTE — Lactation Note (Signed)
This note was copied from a baby's chart. Lactation Consultation Note  Patient Name: Penny Wilson NOIBB'C Date: 01/16/2020 Reason for consult: Follow-up assessment  Baby was cueing in bassinet after BG check. LC woke Mother to ask if we could attempt latch. Baby was latched in sidelying on the right with a rhythmic sucking pattern and many swallows noted. Grandmother was left at baby's side, due to Mother sleeping.   Maternal Data Formula Feeding for Exclusion: No Has patient been taught Hand Expression?: Yes Does the patient have breastfeeding experience prior to this delivery?: No  Feeding Feeding Type: Breast Fed  LATCH Score Latch: Grasps breast easily, tongue down, lips flanged, rhythmical sucking. (latched after 4 attempts, maintaining latch)  Audible Swallowing: Spontaneous and intermittent  Type of Nipple: Everted at rest and after stimulation  Comfort (Breast/Nipple): Soft / non-tender  Hold (Positioning): Full assist, staff holds infant at breast  LATCH Score: 8  Interventions Interventions: Breast feeding basics reviewed (Reviewed with G'ma in the room, Mother is asleep)  Lactation Tools Discussed/Used Tools: Bottle;Other (comment) (spoon)   Consult Status Consult Status: Follow-up Date: 01/17/20 Follow-up type: Call as needed    Ciel Yanes D Rishik Tubby 01/16/2020, 11:41 AM

## 2020-01-16 NOTE — Discharge Instructions (Signed)
  Discharge Instructions:    Follow-up Appointment: Schedule your postpartum follow-up appointment ASAP for a visit in 6 weeks!    If there are any new medications, they have been ordered and will be available for pickup at the listed pharmacy on your way home from the hospital.    Call office if you have any of the following: headache, visual changes, fever >101.0 F, chills, shortness of breath, breast concerns, excessive vaginal bleeding, incision drainage or problems, leg pain or redness, depression or any other concerns. If you have vaginal discharge with an odor, let your doctor know.    It is normal to bleed for up to 6 weeks. You should not soak through more than 1 pad in 1 hour. If you have a blood clot larger than your fist with continued bleeding, call your doctor.    Activity: Do not lift > 10 lbs for 6 weeks (do not lift anything heavier than your baby). No intercourse, tampons, swimming pools, hot tubs, baths (only showers) for 6 weeks.  No driving for 1-2 weeks. Continue prenatal vitamin, especially if breastfeeding. Increase calories and fluids (water) while breastfeeding.    Your milk will come in, in the next couple of days (right now it is colostrum). You may have a slight fever when your milk comes in, but it should go away on its own.  If it does not, and rises above 101 F please call the doctor. You will also feel achy and your breasts will be firm. They will also start to leak. If you are breastfeeding, continue as you have been and you can pump/express milk for comfort.    If you have too much milk, your breasts can become engorged, which could lead to mastitis. This is an infection of the milk ducts. It can be very painful and you will need to notify your doctor to obtain a prescription for antibiotics. You can also treat it with a shower or hot/cold compress.    For concerns about your baby, please call your pediatrician.  For breastfeeding concerns, the lactation  consultant can be reached at [phone].    Postpartum blues (feelings of happy one minute and sad another minute) are normal for the first few weeks but if it gets worse let your doctor know.    Congratulations! We enjoyed caring for you and your new bundle of joy!   

## 2020-01-16 NOTE — Progress Notes (Signed)
CSW received consult for: "Patient displaced, now living with a friend. Transferred to [place] at [redacted] weeks pregnant. Teen. Please assess for resources and well-being"  CSW completed chart review. MOB with history of depression and anxiety. MOB had + UDS in July. Baby's UDS pending.   Attempted assessment, other staff in MOB room. CSW spoke to RN, no additional concerns and MOB is appropriate and has a good support system. CSW will meet with MOB for assessment tomorrow prior to discharge.  Alfonso Ramus, [place] [phone]

## 2020-01-16 NOTE — Lactation Note (Signed)
This note was copied from a baby's chart. Lactation Consultation Note  Patient Name: Penny Wilson [id number] Date: 01/16/2020 Reason for consult: Initial assessment;[place];Early term 37-38.6wks;Infant < 6lbs;Other (Comment) (hypoglycemia)   Maternal Data Formula Feeding for Exclusion: No Has patient been taught Hand Expression?: Yes Does the patient have breastfeeding experience prior to this delivery?: No  Feeding Feeding Type: Bottle Fed - Formula (Hand expressed milk)  Lactation to the room for initial visit. Baby had a low BG, Instructor and student at bassinet teaching how to pace bottle feed the formula bottle. Encouraged feeding on demand and with cues. If baby is not cueing encouraged hand expression and skin to skin. Taught proper technique for hand expression and spoon feeding. LC and Mother expressed and fed to baby. Mother was unable to hand express effectively this time but had used the [facility] and had colostrum in it. Baby tolerated spoon feed well. Baby was left skin to skin with Mother. Encouraged 8 or more attempts in the first 24 hours and 8 or more good feeds after 24 HOL.  [facility] # left on board, encouraged to call for any assistance. Mother has no further questions at this time.   Interventions Interventions: Breast feeding basics reviewed;Skin to skin;Hand express;Expressed milk  Lactation Tools Discussed/Used     Consult Status Consult Status: Follow-up Date: 01/17/20 Follow-up type: Call as needed    Lyanna Blystone D Dalexa Gentz 01/16/2020, 10:23 AM

## 2020-01-17 ENCOUNTER — Encounter: Payer: Medicaid Other | Admitting: Obstetrics & Gynecology

## 2020-01-17 ENCOUNTER — Other Ambulatory Visit: Payer: Medicaid Other

## 2020-01-17 DIAGNOSIS — F321 Major depressive disorder, single episode, moderate: Secondary | ICD-10-CM

## 2020-01-17 LAB — CBC
HCT: 28.9 % — ABNORMAL LOW (ref 36.0–46.0)
Hemoglobin: 9.2 g/dL — ABNORMAL LOW (ref 12.0–15.0)
MCH: 25.9 pg — ABNORMAL LOW (ref 26.0–34.0)
MCHC: 31.8 g/dL (ref 30.0–36.0)
MCV: 81.4 fL (ref 80.0–100.0)
Platelets: 196 10*3/uL (ref 150–400)
RBC: 3.55 MIL/uL — ABNORMAL LOW (ref 3.87–5.11)
RDW: 15.6 % — ABNORMAL HIGH (ref 11.5–15.5)
WBC: 11.3 10*3/uL — ABNORMAL HIGH (ref 4.0–10.5)
nRBC: 0 % (ref 0.0–0.2)

## 2020-01-17 LAB — VARICELLA ZOSTER ANTIBODY, IGG: Varicella IgG: 185 index (ref >165)

## 2020-01-17 MED ORDER — IBUPROFEN 600 MG PO TABS
600.0000 mg | ORAL_TABLET | Freq: Four times a day (QID) | ORAL | Status: DC
  Administered 2020-01-17 – 2020-01-18 (×3): 600 mg via ORAL
  Filled 2020-01-17 (×3): qty 1

## 2020-01-17 MED ORDER — SERTRALINE HCL 25 MG PO TABS
25.0000 mg | ORAL_TABLET | Freq: Every day | ORAL | Status: DC
  Administered 2020-01-17 – 2020-01-18 (×2): 25 mg via ORAL
  Filled 2020-01-17 (×2): qty 1

## 2020-01-17 NOTE — Anesthesia Postprocedure Evaluation (Signed)
Anesthesia Post Note  Patient: Penny Wilson  Procedure(s) Performed: AN AD HOC LABOR EPIDURAL  Patient location during evaluation: Mother Baby Anesthesia Type: Epidural Level of consciousness: awake and alert Pain management: pain level controlled Vital Signs Assessment: post-procedure vital signs reviewed and stable Respiratory status: spontaneous breathing, nonlabored ventilation and respiratory function stable Cardiovascular status: stable Postop Assessment: no headache, no backache and epidural receding Anesthetic complications: no   No complications documented.   Last Vitals:  Vitals:   01/16/20 2330 01/17/20 0354  BP: 123/79 136/84  Pulse: 86 91  Resp: 16 16  Temp: 37 C 36.9 C  SpO2: 98% 98%    Last Pain:  Vitals:   01/17/20 0452  TempSrc:   PainSc: 0-No pain                 Syncere Eble B Alonza Smoker

## 2020-01-17 NOTE — Clinical Social Work Maternal (Signed)
CLINICAL SOCIAL WORK MATERNAL/CHILD NOTE  Patient Details  Name: Nuvia R Simoneaux MRN: [id number] Date of Birth: 09/08/2000  Date:  01/17/2020  Clinical Social Worker Initiating Note:  Jasan Doughtie Date/Time: Initiated:  01/17/20/      Child's Name:  Harrison Buchanan Norris   Biological Parents:  Mother, Father   Need for Interpreter:  None   Reason for Referral:  Homelessness, Other (Comment) (Edinburgh Score: 10)   Address:  [address] [place] [address]    Phone number:  [phone] (home)     Additional phone number: None  Household Members/Support Persons (HM/SP):   Household Member/Support Person 1, Household Member/Support Person 2, Household Member/Support Person 3   HM/SP Name Relationship DOB or Age  HM/SP -1 Bryan Norris FOB unknown  HM/SP -2 Unknown FOB's step mother unknown  HM/SP -3 Unknown FOB's father unknown  HM/SP -4        HM/SP -5        HM/SP -6        HM/SP -7        HM/SP -8          Natural Supports (not living in the home):  Extended Family, Friends, Immediate Family   Professional Supports:     Employment: Unemployed   Type of Work:     Education:  9 to 11 years   Homebound arranged: No (Completed 10th grade)  Financial Resources:  Medicaid   Other Resources:  WIC, Food Stamps    Cultural/Religious Considerations Which May Impact Care:  None  Strengths:  Ability to meet basic needs , Compliance with medical plan , Pediatrician chosen, Home prepared for child , Understanding of illness   Psychotropic Medications:         Pediatrician:    [place]  Pediatrician List:       [place]    [place] [facility]  [place]    [place]    [place]      Pediatrician Fax Number:    Risk Factors/Current Problems:  Substance Use , Mental Health Concerns    Cognitive State:  Alert , Able to Concentrate , Goal Oriented    Mood/Affect:  Calm , Happy     CSW Assessment: CSW received a consult for MOB being displaced living with friend, Edinburgh Score of 10, teen mom. Per policy, MOB is not a teen mom. Per chart review, MOB UDS was positive for marijuana. Baby UDS was negative, CDS pending.   CSW spoke with RN Emily prior to meeting with MOB. Per RN, MOB is appropriate and there are no additional concerns.   CSW met with MOB at bedside. Explained HIPPA and MOB elected for FOB (Bryan Norris) and Bryan's step mother to remain at bedside during assessment. Explained CSW's role and reason for referral.  MOB reported she is feeling good overall post delivery. MOB reported she has had some ups and downs emotionally since delivery. MOB was alert, appropriate, and attentive to Baby during assessment.   Confirmed contact information for MOB. MOB and Baby will be living with FOB, FOB's step mother, and FOB's father at discharge. MOB reported she and FOB recently moved to this area from [place] to live with FOB's family for a more supportive home environment. She reported she feels safe and supported at home with them.  Explained Hospital Drug Screen/CPS Report Policy. CPS Report made to [place] CPS Worker Haley following assessment with MOB, as required by policy. Informed   Haley of plan for MOB and Baby to discharge today and asked to be informed of any barriers to discharge prior to then.   MOB reported she receives WIC and Food Stamps and is working on switching them to [place]. MOB plans to use [facility] for Baby's Medical Care. MOB reported she has a bassinett, car seat, clothing, diapers, and all other items needed for Baby. MOB reported she has reliable transportation for herself and Baby. MOB denied resource needs at this time.   MOB reported she has a history of anxiety and depression. MOB reported she took medications for mental health in middle school, but did not like how they made her feel and hasn't taken them  since then. She reported she also saw several therapists in the past, but did not feel it was beneficial for her. MOB reported she has a good support system. MOB reported she has had some ups and downs emotionally since having Baby and before having Baby. CSW provided mental health resource list and updated RN. MOB denied SI, HI, or DV.   CSW provided education and information sheets on PPD and SIDS. MOB verbalized understanding. CSW ecouraged MOB to reach out to her Provider with any questions or needs for support or resources, even after discharge.   MOB denied any needs or questions at this time. CSW encouraged MOB to reach out if any arise prior to discharge.   Please re consult CSW if any additional needs or concerns arise.  CSW Plan/Description:  Sudden Infant Death Syndrome (SIDS) Education, Perinatal Mood and Anxiety Disorder (PMADs) Education, Other Patient/Family Education, Hospital Drug Screen Policy Information, Other Information/Referral to Community Resources, Child Protective Service Report , CSW Awaiting CPS Disposition Plan, CSW Will Continue to Monitor Umbilical Cord Tissue Drug Screen Results and Make Report if Warranted    Kimba Lottes E Tyniah Kastens, LCSW 01/17/2020, 9:57 AM  

## 2020-01-17 NOTE — Progress Notes (Signed)
Post Partum Day 1 Subjective: Doing well, no complaints.  Tolerating regular diet, pain with PO meds, voiding and ambulating without difficulty. Appears to be bonding well with infant, FOB changing diaper.   No CP SOB Fever,Chills, N/V or leg pain; denies nipple or breast pain, no HA change of vision, RUQ/epigastric pain  Objective: BP 137/80 (BP Location: Right Arm)   Pulse 92   Temp 98.3 F (36.8 C) (Oral)   Resp 16   Ht 5\' 3"  (1.6 m)   Wt 100.2 kg   LMP 04/21/2019 (Approximate)   SpO2 99%   Breastfeeding Unknown   BMI 39.15 kg/m    Physical Exam:  General: NAD Breasts: soft/nontender CV: RRR Pulm: nl effort, CTABL Abdomen: soft, NT, BS x 4 Perineum: minimal edema, repair well approximated Lochia: scant Uterine Fundus: fundus firm and 2 fb below umbilicus DVT Evaluation: no cords, ttp LEs   Recent Labs    01/15/20 0039 01/17/20 0707  HGB 10.2* 9.2*  HCT 31.1* 28.9*  WBC 11.0* 11.3*  PLT 264 196    Assessment/Plan: 19 y.o. G1P1001 postpartum day # 1  - Continue routine PP care - encouraged snug fitting bra and cabbage leaves for bottlefeeding.  - pending social work consult - infant remains on bili-lights. - Discussed contraceptive options including implant, IUDs hormonal and non-hormonal, injection, pills/ring/patch, condoms, and NFP. Pt undecided on contraception.   Disposition: Does not desire Dc home today.     13/05/21, CNM 01/17/2020  6:40 PM

## 2020-01-17 NOTE — Lactation Note (Signed)
This note was copied from a baby's chart. Mom has decided to formula feed baby, baby under bili lites,  given formula prep info sheets and cronobacter info sheet and reviewed them with her and fob.
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
## 2020-01-18 MED ORDER — DOCUSATE SODIUM 100 MG PO CAPS: 100.0000 mg | ORAL_CAPSULE | Freq: Two times a day (BID) | ORAL | 0 refills | Status: DC

## 2020-01-18 MED ORDER — IBUPROFEN 600 MG PO TABS: 600.0000 mg | ORAL_TABLET | Freq: Four times a day (QID) | ORAL | 0 refills | Status: DC

## 2020-01-18 MED ORDER — LABETALOL HCL 200 MG PO TABS: 200.0000 mg | ORAL_TABLET | Freq: Two times a day (BID) | ORAL | 1 refills | Status: DC

## 2020-01-18 MED ORDER — NIFEDIPINE ER OSMOTIC RELEASE 30 MG PO TB24
30.0000 mg | ORAL_TABLET | Freq: Every day | ORAL | Status: DC
  Administered 2020-01-18: 30 mg via ORAL
  Filled 2020-01-18: qty 1

## 2020-01-18 MED ORDER — ACETAMINOPHEN 500 MG PO TABS: 1000.0000 mg | ORAL_TABLET | Freq: Four times a day (QID) | ORAL | 0 refills | Status: DC | PRN

## 2020-01-18 MED ORDER — NIFEDIPINE ER 30 MG PO TB24: 30.0000 mg | ORAL_TABLET | Freq: Every day | ORAL | 1 refills | Status: DC

## 2020-01-18 NOTE — Progress Notes (Signed)
Patient discharged home with infant. Discharge instructions and prescriptions given and reviewed with patient. Patient verbalized understanding. Escorted out by auxillary.  

## 2020-01-18 NOTE — Progress Notes (Signed)
Pt has chronic HTN, takes Labetalol 200 mg BID.  BPs 140/80s overnight. BPs this morning 119/91, 120/77.  Per Heloise Ochoa CNM wants to start Procardia 30 mg daily and monitor BPs a few hours prior to discharge. Pt updated on new plan of care- visibly upset and stated she will not wait she is going home and can continue to monitor her BP at home like she did during pregnancy. Sent a secure chat to CNM that patient refuses to stay for BP monitoring. Discharge orders in place, medications sent electronically.  Pt educated on s/s HTN and to make f/u in 1 week for BP and mood check at [facility]. Pt verbalized understanding.

## 2020-01-23 NOTE — Discharge Summary (Signed)
       Show:Clear all [x] Manual[] Template[] Copied  Added by: [] , RN  [] Hover for details Pt reports to unit c/o elevated BP at home. Pt reports a hx of CHTN and has just started taking 200mg  of labetalol yesterday. Pt reports +FM, denies LOF, vaginal bleeding, and ctx. Pt does report a HA but has not taken any medication. Pt denies right epigastric, blurry vision. Initial BP 119/72, cycling q30min.  Ultimately d/c home with precautions

## 2020-10-18 IMAGING — US US ABDOMEN LIMITED
1 series · 14 of 25 positions shown · non-contrast
Comparison: None.

CLINICAL DATA: Elevated LFT, pregnant patient

EXAM:
ULTRASOUND ABDOMEN LIMITED RIGHT UPPER QUADRANT

[Series 1: us abdomen limited · 0.22mm/px · 14 of 44 slices shown]
[im 1/44]
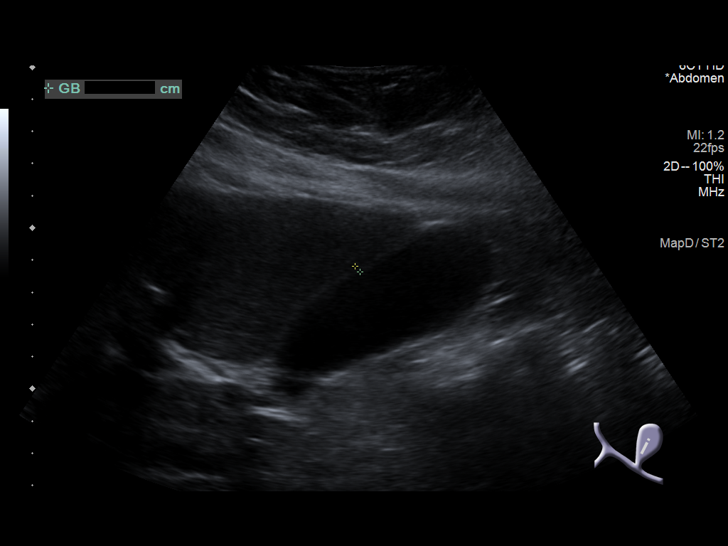
[im 4/44]
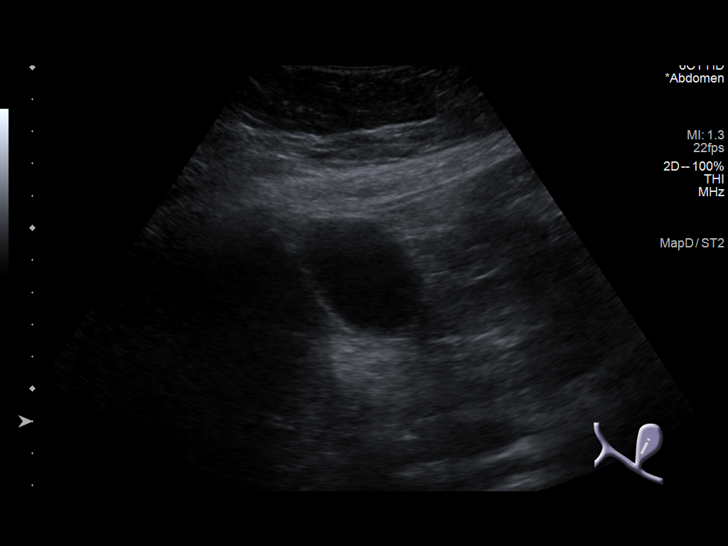
[im 8/44]
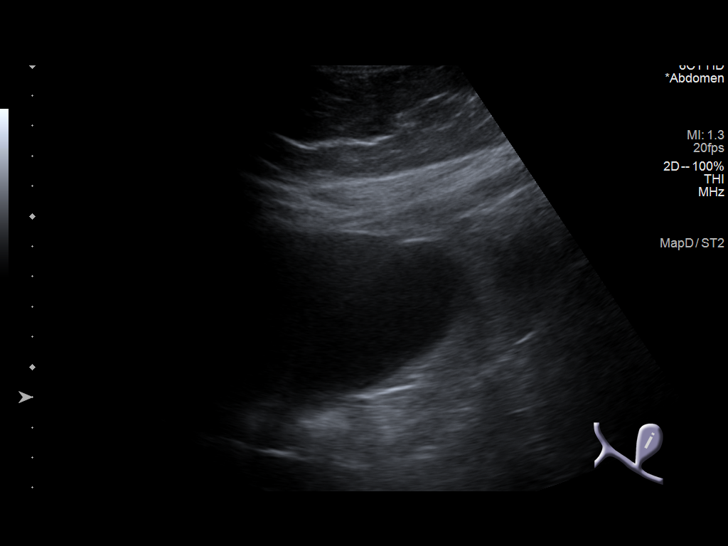
[im 11/44]
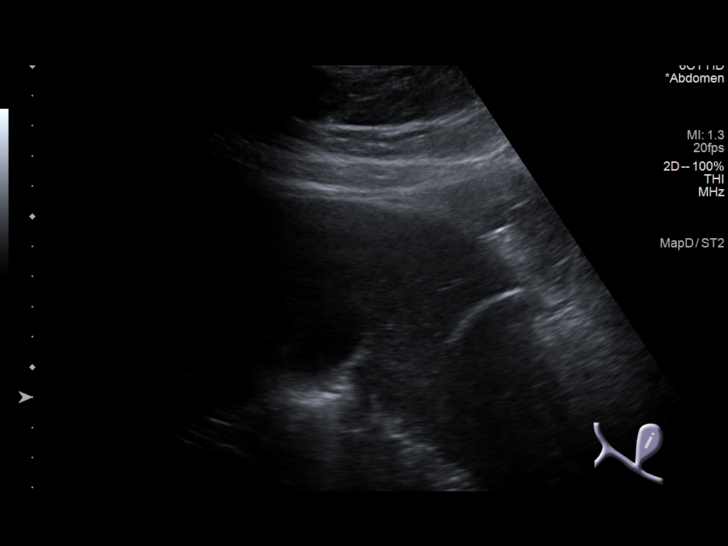
[im 15/44]
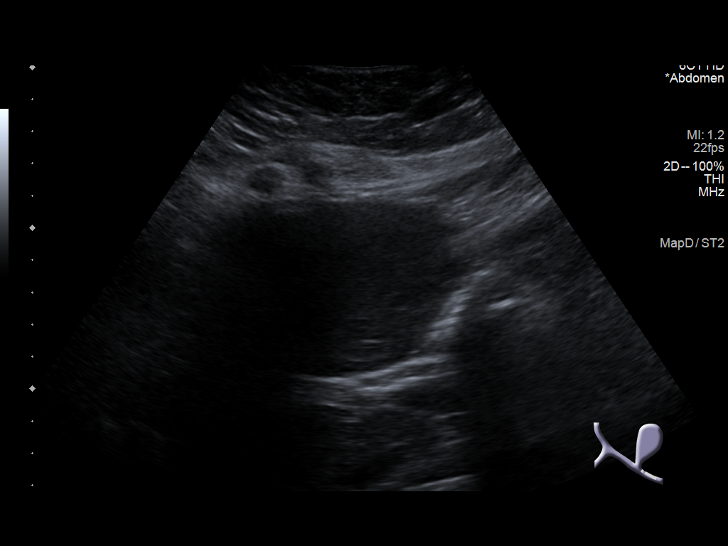
[im 17/44]
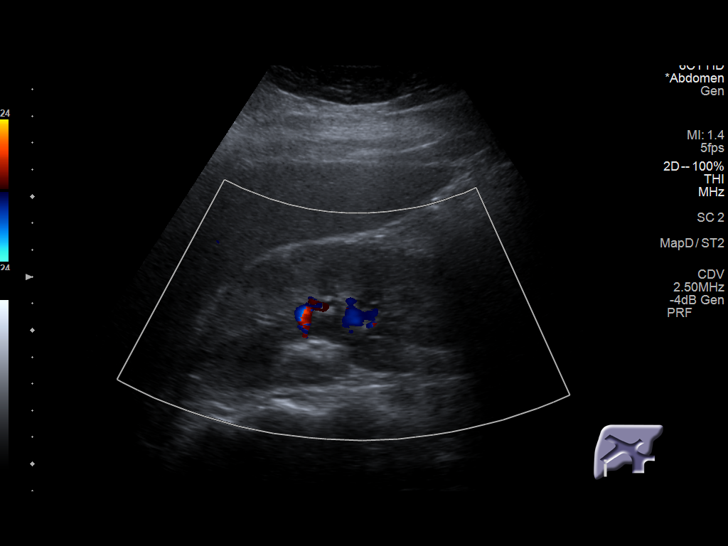
[im 20/44]
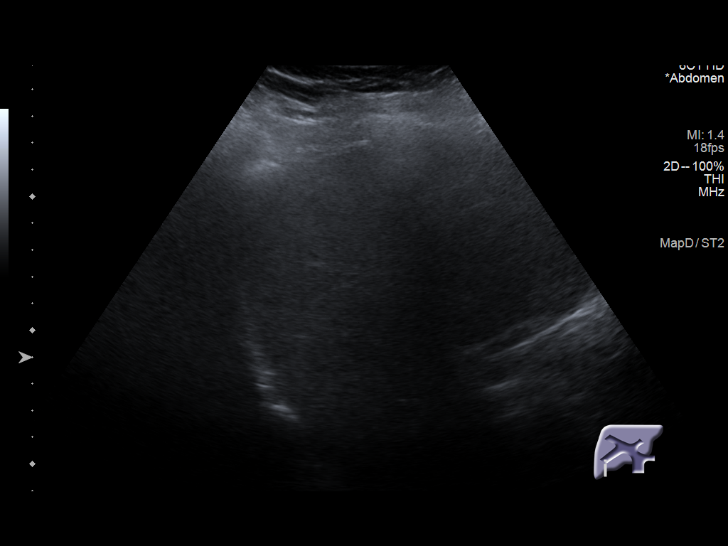
[im 24/44]
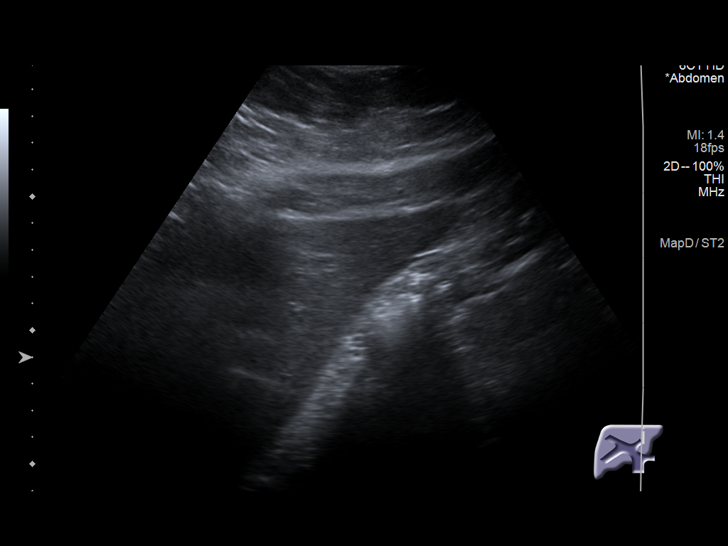
[im 27/44]
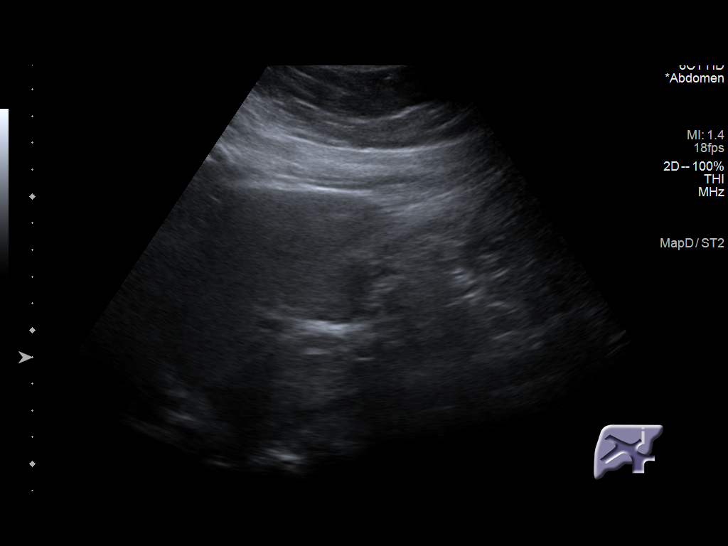
[im 29/44]
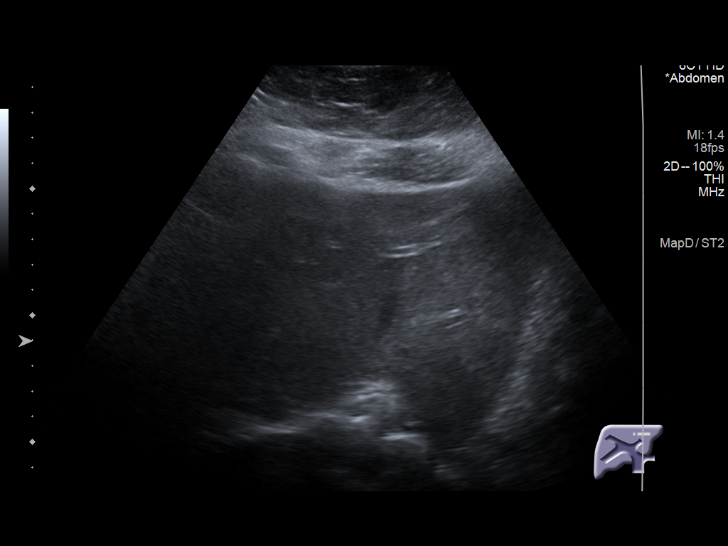
[im 33/44]
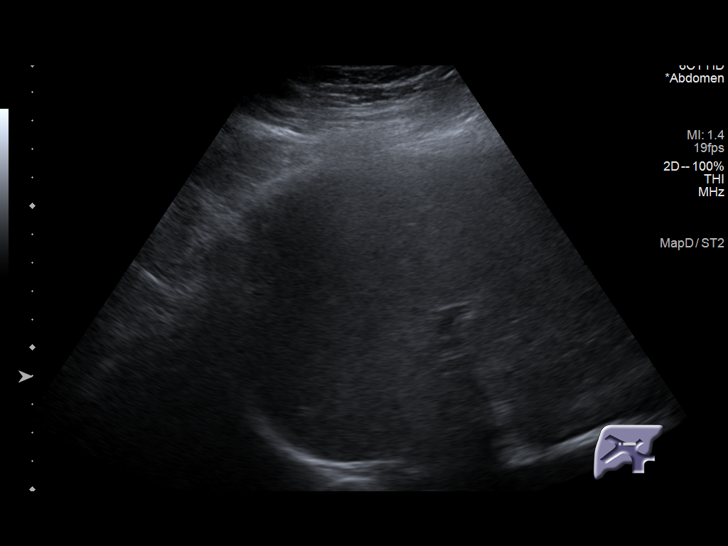
[im 36/44]
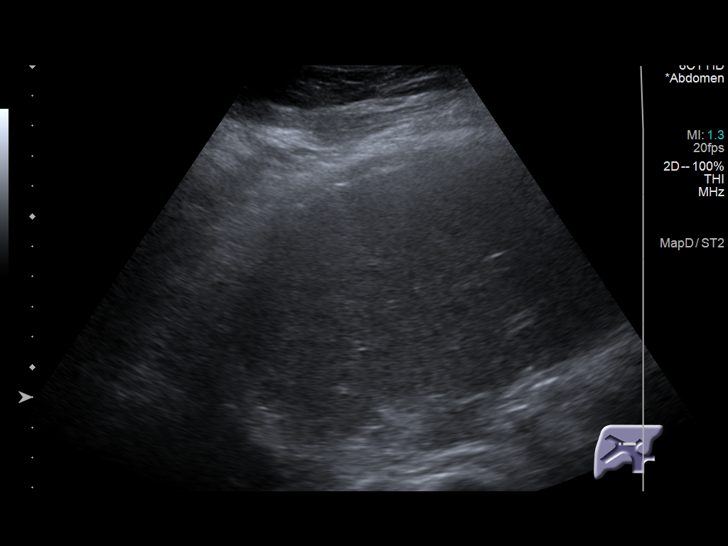
[im 40/44]
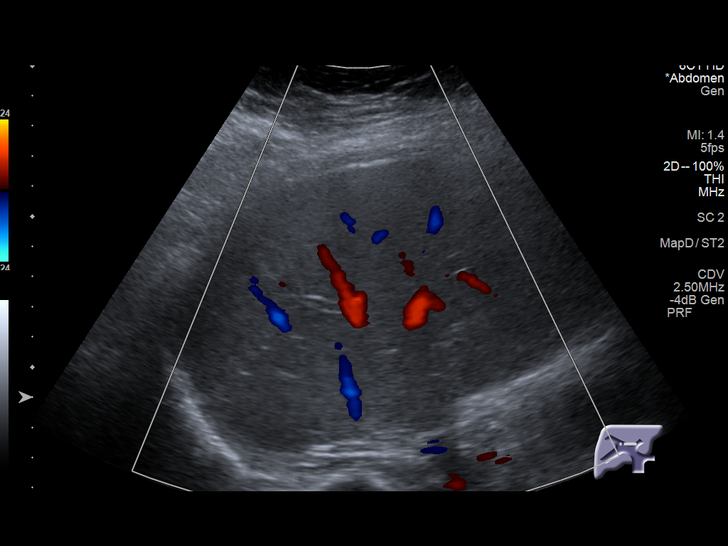
[im 44/44]
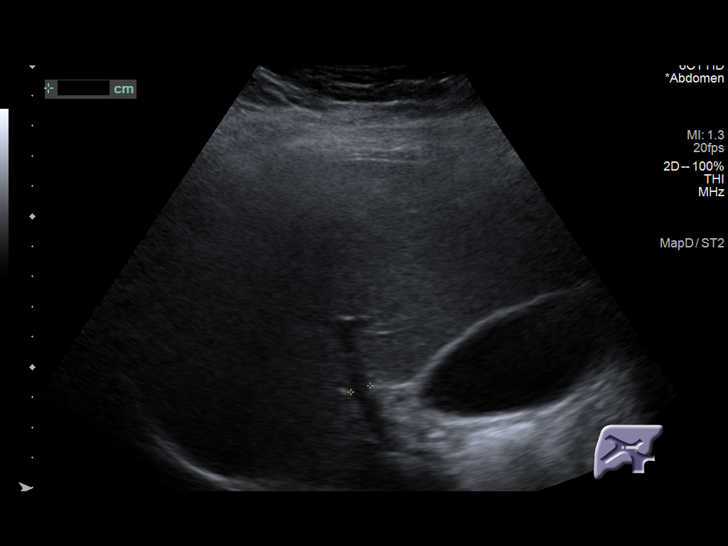

[14 of 25 positions shown; findings below may reference images not displayed]

FINDINGS: Gallbladder:

No gallstones or wall thickening visualized. No sonographic Murphy
sign noted by sonographer.

Common bile duct:

Diameter: 4 mm

Liver:

Liver is slightly echogenic. No focal hepatic abnormality. Portal
vein is patent on color Doppler imaging with normal direction of
blood flow towards the liver.

Other: Incidental note made of minimal right hydronephrosis.
IMPRESSION: 1. Echogenic liver suggesting steatosis.  Negative for gallstones.
2. Minimal right hydronephrosis.

## 2021-03-17 ENCOUNTER — Other Ambulatory Visit: Payer: Self-pay

## 2021-03-17 ENCOUNTER — Ambulatory Visit (INDEPENDENT_AMBULATORY_CARE_PROVIDER_SITE_OTHER): Payer: Medicaid Other | Admitting: Nurse Practitioner

## 2021-03-17 ENCOUNTER — Encounter: Payer: Self-pay | Admitting: Nurse Practitioner

## 2021-03-17 VITALS — BP 104/69 | HR 99 | Temp 98.5°F | Ht 61.5 in | Wt 223.8 lb

## 2021-03-17 DIAGNOSIS — Z7689 Persons encountering health services in other specified circumstances: Secondary | ICD-10-CM

## 2021-03-17 DIAGNOSIS — F321 Major depressive disorder, single episode, moderate: Secondary | ICD-10-CM | POA: Diagnosis not present

## 2021-03-17 DIAGNOSIS — F419 Anxiety disorder, unspecified: Secondary | ICD-10-CM | POA: Diagnosis not present

## 2021-03-17 NOTE — Assessment & Plan Note (Signed)
Chronic. Not well controlled. Patient does not want to try medication she would like to pursue therapy which has worked for her in the past. Denies SI. Referral placed for therapy. Will reassess at next visit.  Discussed Better Help App for therapy options as well.

## 2021-03-17 NOTE — Assessment & Plan Note (Signed)
Chronic. Not well controlled. Patient does not want to try medication she would like to pursue therapy which has worked for her in the past. Denies SI. Referral placed for therapy. Will reassess at next visit.  Discussed Better Help App for therapy options as well. °

## 2021-03-17 NOTE — Progress Notes (Signed)
BP 104/69    Pulse 99    Temp 98.5 F (36.9 C) (Oral)    Ht 5' 1.5" (1.562 m)    Wt 223 lb 12.8 oz (101.5 kg)    SpO2 98%    BMI 41.60 kg/m    Subjective:    Patient ID: Penny Wilson, female    DOB: [birth year], 21 y.o.   MRN: [id number]  HPI: Penny Wilson is a 21 y.o. female  Chief Complaint  Patient presents with   New Patient (Initial Visit)   Establish Care   Depression    Pt states she would like to discuss her depression and anxiety. Has not been on any medications recently.    Patient presents to clinic to establish care with new PCP.  Introduced to Designer, jewellery role and practice setting.  All questions answered.  Discussed provider/patient relationship and expectations.  Patient reports a history of Depression and anxiety but hasn't been on any medications for awhile- she has tried therapy in the past, hypertension in pregnancy and gestational diabetes, migraines.  Patient denies a history of: Hypertension, Elevated Cholesterol, Diabetes, Thyroid problems, Neurological problems, and Abdominal problems.   DEPRESSION/ANXIETY She would like to see a therapist before trying medication.    [facility] Visit from 03/17/2021 in [facility]  PHQ-9 Total Score 11      GAD 7 : Generalized Anxiety Score 03/17/2021 10/23/2019 07/17/2019  Nervous, Anxious, on Edge 3 1 2   Control/stop worrying 3 3 3   Worry too much - different things 3 3 3   Trouble relaxing 2 0 1  Restless 1 0 0  Easily annoyed or irritable 2 3 3   Afraid - awful might happen 3 0 0  Total GAD 7 Score 17 10 12   Anxiety Difficulty Very difficult Not difficult at all Somewhat difficult     Active Ambulatory Problems    Diagnosis Date Noted   Supervision of high risk pregnancy, antepartum 07/17/2019   BMI 39.0-39.9,adult 07/17/2019   Depression 07/17/2019   Rubella non-immune status, antepartum 07/23/2019   Asymptomatic bacteriuria during pregnancy 07/23/2019   Chronic hypertension  affecting pregnancy 08/28/2019   Marijuana use 08/30/2019   Steatosis ([place]) 09/11/2019   Gestational diabetes 10/25/2019   Elevated blood pressure affecting pregnancy in third trimester, antepartum 01/06/2020   High-risk pregnancy in third trimester 01/07/2020   Labor and delivery indication for care or intervention 01/15/2020   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 Negative for suicidal ideas. The patient is nervous/anxious.    Per HPI unless specifically indicated above     Objective:    BP 104/69    Pulse 99    Temp 98.5 F (36.9 C) (Oral)    Ht 5' 1.5" (1.562 m)    Wt 223 lb 12.8 oz (101.5 kg)    SpO2 98%    BMI 41.60 kg/m   Wt Readings from Last  3 Encounters:  03/17/21 223 lb 12.8 oz (101.5 kg)  01/15/20 221 lb (100.2 kg) (99 %, Z= 2.22)*  01/09/20 220 lb (99.8 kg) (99 %, Z= 2.20)*   * Growth percentiles are based on CDC (Girls, 2-20 Years) data.    Physical Exam Vitals and nursing note reviewed.  Constitutional:      General: She is not in acute distress.    Appearance: Normal appearance. She is obese. She is not ill-appearing, toxic-appearing or diaphoretic.  HENT:     Head: Normocephalic.     Right Ear: External ear normal.     Left Ear: External ear normal.     Nose: Nose normal.     Mouth/Throat:     Mouth: Mucous membranes are moist.     Pharynx: Oropharynx is clear.  Eyes:     General:        Right eye: No discharge.        Left eye: No discharge.      Extraocular Movements: Extraocular movements intact.     Conjunctiva/sclera: Conjunctivae normal.     Pupils: Pupils are equal, round, and reactive to light.  Cardiovascular:     Rate and Rhythm: Normal rate and regular rhythm.     Heart sounds: No murmur heard. Pulmonary:     Effort: Pulmonary effort is normal. No respiratory distress.     Breath sounds: Normal breath sounds. No wheezing or rales.  Musculoskeletal:     Cervical back: Normal range of motion and neck supple.  Skin:    General: Skin is warm and dry.     Capillary Refill: Capillary refill takes less than 2 seconds.  Neurological:     General: No focal deficit present.     Mental Status: She is alert and oriented to person, place, and time. Mental status is at baseline.  Psychiatric:        Mood and Affect: Mood normal.        Behavior: Behavior normal.        Thought Content: Thought content normal.        Judgment: Judgment normal.    Results for orders placed or performed during the hospital encounter of 01/15/20  Respiratory Panel by RT PCR (Flu A&B, Covid) - Nasopharyngeal Swab   Specimen: Nasopharyngeal Swab  Result Value Ref Range   SARS Coronavirus 2 by RT PCR NEGATIVE NEGATIVE   Influenza A by PCR NEGATIVE NEGATIVE   Influenza B by PCR NEGATIVE NEGATIVE  OB RESULT CONSOLE Group B Strep  Result Value Ref Range   GBS Negative   CBC  Result Value Ref Range   WBC 11.0 (H) 4.0 - 10.5 K/uL   RBC 3.85 (L) 3.87 - 5.11 MIL/uL   Hemoglobin 10.2 (L) 12.0 - 15.0 g/dL   HCT 31.1 (L) 36.0 - 46.0 %   MCV 80.8 80.0 - 100.0 fL   MCH 26.5 26.0 - 34.0 pg   MCHC 32.8 30.0 - 36.0 g/dL   RDW 14.7 11.5 - 15.5 %   Platelets 264 150 - 400 K/uL   nRBC 0.0 0.0 - 0.2 %  Comprehensive metabolic panel  Result Value Ref Range   Sodium 136 135 - 145 mmol/L   Potassium 3.9 3.5 - 5.1 mmol/L   Chloride 105 98 - 111 mmol/L   CO2 22 22 - 32 mmol/L   Glucose, Bld 90 70 - 99 mg/dL   BUN 11 6 - 20 mg/dL   Creatinine, Ser 0.73 0.44  -  1.00 mg/dL   Calcium 9.4 8.9 - 10.3 mg/dL   Total Protein 6.1 (L) 6.5 - 8.1 g/dL   Albumin 2.6 (L) 3.5 - 5.0 g/dL   AST 18 15 - 41 U/L   ALT 19 0 - 44 U/L   Alkaline Phosphatase 136 (H) 38 - 126 U/L   Total Bilirubin 0.5 0.3 - 1.2 mg/dL   GFR, Estimated >60 >60 mL/min   Anion gap 9 5 - 15  Protein / creatinine ratio, urine  Result Value Ref Range   Creatinine, Urine 213 mg/dL   Total Protein, Urine 62 mg/dL   Protein Creatinine Ratio 0.29 (H) 0.00 - 0.15 mg/mg[Cre]  RPR  Result Value Ref Range   RPR Ser Ql NON REACTIVE NON REACTIVE  Urine Drug Screen, Qualitative ([facility] only)  Result Value Ref Range   Tricyclic, Ur Screen NONE DETECTED NONE DETECTED   Amphetamines, Ur Screen NONE DETECTED NONE DETECTED   MDMA (Ecstasy)Ur Screen NONE DETECTED NONE DETECTED   Cocaine Metabolite,Ur [place] NONE DETECTED NONE DETECTED   Opiate, Ur Screen NONE DETECTED NONE DETECTED   Phencyclidine (PCP) Ur S NONE DETECTED NONE DETECTED   Cannabinoid 50 Ng, Ur [place] NONE DETECTED NONE DETECTED   Barbiturates, Ur Screen NONE DETECTED NONE DETECTED   Benzodiazepine, Ur Scrn NONE DETECTED NONE DETECTED   Methadone Scn, Ur NONE DETECTED NONE DETECTED  OB RESULTS CONSOLE GC/Chlamydia  Result Value Ref Range   Gonorrhea Negative    Chlamydia Negative   Glucose, capillary  Result Value Ref Range   Glucose-Capillary 87 70 - 99 mg/dL  Varicella zoster antibody, IgG  Result Value Ref Range   Varicella IgG 185 Immune >165 index  Glucose, capillary  Result Value Ref Range   Glucose-Capillary 98 70 - 99 mg/dL  Glucose, capillary  Result Value Ref Range   Glucose-Capillary 82 70 - 99 mg/dL  Glucose, capillary  Result Value Ref Range   Glucose-Capillary 82 70 - 99 mg/dL  Glucose, capillary  Result Value Ref Range   Glucose-Capillary 77 70 - 99 mg/dL  Glucose, capillary  Result Value Ref Range   Glucose-Capillary 96 70 - 99 mg/dL  CBC  Result Value Ref Range   WBC 11.3 (H) 4.0 - 10.5 K/uL   RBC 3.55 (L)  3.87 - 5.11 MIL/uL   Hemoglobin 9.2 (L) 12.0 - 15.0 g/dL   HCT 28.9 (L) 36.0 - 46.0 %   MCV 81.4 80.0 - 100.0 fL   MCH 25.9 (L) 26.0 - 34.0 pg   MCHC 31.8 30.0 - 36.0 g/dL   RDW 15.6 (H) 11.5 - 15.5 %   Platelets 196 150 - 400 K/uL   nRBC 0.0 0.0 - 0.2 %  Type and screen  Result Value Ref Range   ABO/RH(D) O POS    Antibody Screen NEG    Sample Expiration      01/18/2020,2359 Performed at [facility], [address] [place], [place] [address]   ABO/Rh  Result Value Ref Range   ABO/RH(D)      O POS Performed at [facility] [place] [place], [place] [address]       Assessment & Plan:   Problem List Items Addressed This Visit       Other   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 Not well controlled. Patient does not want to try medication she would like to pursue therapy which has worked for her in the past. Denies SI.  Referral placed for therapy. Will reassess at next visit.  Discussed Better Help App for therapy options as well.      Relevant Orders   Ambulatory referral to Psychology   Anxiety    Chronic. Not well controlled. Patient does not want to try medication she would like to pursue therapy which has worked for her in the past. Denies SI. Referral placed for therapy. Will reassess at next visit.  Discussed Better Help App for therapy options as well.      Relevant Orders   Ambulatory referral to Psychology   Other Visit Diagnoses     Encounter to establish care       Return in 1 month for physical and fasting labs.        Follow up plan: Return in about 1 year (around 03/17/2022) for Physical and Fasting labs.

## 2021-04-16 ENCOUNTER — Encounter: Payer: Medicaid Other | Admitting: Nurse Practitioner

## 2021-04-22 ENCOUNTER — Ambulatory Visit (INDEPENDENT_AMBULATORY_CARE_PROVIDER_SITE_OTHER): Payer: Medicaid Other | Admitting: Nurse Practitioner

## 2021-04-22 ENCOUNTER — Other Ambulatory Visit: Payer: Self-pay

## 2021-04-22 ENCOUNTER — Encounter: Payer: Self-pay | Admitting: Nurse Practitioner

## 2021-04-22 VITALS — BP 106/73 | HR 90 | Temp 99.0°F | Ht 61.5 in | Wt 222.2 lb

## 2021-04-22 DIAGNOSIS — F32A Depression, unspecified: Secondary | ICD-10-CM | POA: Diagnosis not present

## 2021-04-22 DIAGNOSIS — F419 Anxiety disorder, unspecified: Secondary | ICD-10-CM

## 2021-04-22 DIAGNOSIS — Z0000 Encounter for general adult medical examination without abnormal findings: Secondary | ICD-10-CM | POA: Diagnosis not present

## 2021-04-22 LAB — URINALYSIS, ROUTINE W REFLEX MICROSCOPIC
Bilirubin, UA: NEGATIVE
Glucose, UA: NEGATIVE
Ketones, UA: NEGATIVE
Leukocytes,UA: NEGATIVE
Nitrite, UA: NEGATIVE
Specific Gravity, UA: >1.03 — ABNORMAL HIGH (ref 1.005–1.030)
Urobilinogen, Ur: 0.2 mg/dL (ref 0.2–1.0)
pH, UA: 6 (ref 5.0–7.5)

## 2021-04-22 LAB — MICROSCOPIC EXAMINATION: WBC, UA: NONE SEEN /hpf (ref 0–5)

## 2021-04-22 NOTE — Assessment & Plan Note (Signed)
Chronic.  Controlled without medication..  Labs ordered today.  Return to clinic in 6 months for reevaluation.  Call sooner if concerns arise.  ° °

## 2021-04-22 NOTE — Progress Notes (Addendum)
BP 106/73    Pulse 90    Temp 99 F (37.2 C) (Oral)    Ht 5' 1.5" (1.562 m)    Wt 222 lb 3.2 oz (100.8 kg)    LMP 04/07/2021 (Approximate)    SpO2 98%    Breastfeeding No    BMI 41.30 kg/m    Subjective:    Patient ID: Penny Wilson, female    DOB: Dec 18, 2000, 20 y.o.   MRN: [id number]  HPI: Penny Wilson is a 21 y.o. female presenting on 04/22/2021 for comprehensive medical examination. Current medical complaints include:none  She currently lives with: Menopausal Symptoms: no   Denies HA, CP, SOB, dizziness, palpitations, visual changes, and lower extremity swelling.   DEPRESSION/ANXIETY Patient states her depression and anxiety are well controlled. She is not currently taking any medication and feels like that is working well for her at this time. Denies concerns at visit today.    Depression Screen done today and results listed below:  Depression screen [facility] 2/9 04/22/2021 03/17/2021 10/23/2019 07/17/2019  Decreased Interest 1 1 1 2   Down, Depressed, Hopeless 0 3 1 2   PHQ - 2 Score 1 4 2 4   Altered sleeping 0 0 0 2  Tired, decreased energy 1 1 3 3   Change in appetite 0 2 0 2  Feeling bad or failure about yourself  0 3 0 0  Trouble concentrating 1 0 0 0  Moving slowly or fidgety/restless 0 1 0 0  Suicidal thoughts 0 0 0 0  PHQ-9 Score 3 11 5 11   Difficult doing work/chores Not difficult at all Very difficult Not difficult at all Somewhat difficult   GAD 7 : Generalized Anxiety Score 04/22/2021 03/17/2021 10/23/2019 07/17/2019  Nervous, Anxious, on Edge 1 3 1 2   Control/stop worrying 1 3 3 3   Worry too much - different things 0 3 3 3   Trouble relaxing 0 2 0 1  Restless 0 1 0 0  Easily annoyed or irritable 0 2 3 3   Afraid - awful might happen 0 3 0 0  Total GAD 7 Score 2 17 10 12   Anxiety Difficulty Not difficult at all Very difficult Not difficult at all Somewhat difficult     The patient does not have a history of falls. I did complete a risk assessment for falls. A plan of  care for falls was documented.   Past Medical History:  Past Medical History:  Diagnosis Date   ADHD    Anxiety    Depression    Gestational diabetes    Hypertension    Pregnancy induced hypertension     Surgical History:  Past Surgical History:  Procedure Laterality Date   NO PAST SURGERIES      Medications:  No current outpatient medications on file prior to visit.   No current facility-administered medications on file prior to visit.    Allergies:  No Known Allergies  Social History:  Social History   Socioeconomic History   Marital status: Single    Spouse name: Not on file   Number of children: Not on file   Years of education: Not on file   Highest education level: Not on file  Occupational History   Not on file  Tobacco Use   Smoking status: Never   Smokeless tobacco: Never  Vaping Use   Vaping Use: Former  Substance and Sexual Activity   Alcohol use: Never   Drug use: Never   Sexual activity: Not Currently  Other Topics Concern   Not on file  Social History Narrative   Not on file   Social Determinants of Health   Financial Resource Strain: Not on file  Food Insecurity: Not on file  Transportation Needs: Not on file  Physical Activity: Not on file  Stress: Not on file  Social Connections: Not on file  Intimate Partner Violence: Not on file   Social History   Tobacco Use  Smoking Status Never  Smokeless Tobacco Never   Social History   Substance and Sexual Activity  Alcohol Use Never    Family History:  Family History  Problem Relation Age of Onset   Narcolepsy Mother    Epilepsy Mother    Narcolepsy Maternal Grandfather     Past medical history, surgical history, medications, allergies, family history and social history reviewed with patient today and changes made to appropriate areas of the chart.   Review of Systems  Eyes:  Negative for blurred vision and double vision.  Respiratory:  Negative for shortness of breath.    Cardiovascular:  Negative for chest pain, palpitations and leg swelling.  Neurological:  Negative for dizziness and headaches.  All other ROS negative except what is listed above and in the HPI.      Objective:    BP 106/73    Pulse 90    Temp 99 F (37.2 C) (Oral)    Ht 5' 1.5" (1.562 m)    Wt 222 lb 3.2 oz (100.8 kg)    LMP 04/07/2021 (Approximate)    SpO2 98%    Breastfeeding No    BMI 41.30 kg/m   Wt Readings from Last 3 Encounters:  04/22/21 222 lb 3.2 oz (100.8 kg)  03/17/21 223 lb 12.8 oz (101.5 kg)  01/15/20 221 lb (100.2 kg) (99 %, Z= 2.22)*   * Growth percentiles are based on CDC (Girls, 2-20 Years) data.    Physical Exam Vitals and nursing note reviewed.  Constitutional:      General: She is awake. She is not in acute distress.    Appearance: She is well-developed. She is obese. She is not ill-appearing.  HENT:     Head: Normocephalic and atraumatic.     Right Ear: Hearing, tympanic membrane, ear canal and external ear normal. No drainage.     Left Ear: Hearing, tympanic membrane, ear canal and external ear normal. No drainage.     Nose: Nose normal.     Right Sinus: No maxillary sinus tenderness or frontal sinus tenderness.     Left Sinus: No maxillary sinus tenderness or frontal sinus tenderness.     Mouth/Throat:     Mouth: Mucous membranes are moist.     Pharynx: Oropharynx is clear. Uvula midline. No pharyngeal swelling, oropharyngeal exudate or posterior oropharyngeal erythema.  Eyes:     General: Lids are normal.        Right eye: No discharge.        Left eye: No discharge.     Extraocular Movements: Extraocular movements intact.     Conjunctiva/sclera: Conjunctivae normal.     Pupils: Pupils are equal, round, and reactive to light.     Visual Fields: Right eye visual fields normal and left eye visual fields normal.  Neck:     Thyroid: No thyromegaly.     Vascular: No carotid bruit.     Trachea: Trachea normal.  Cardiovascular:     Rate and Rhythm:  Normal rate and regular rhythm.     Heart  sounds: Normal heart sounds. No murmur heard.   No gallop.  Pulmonary:     Effort: Pulmonary effort is normal. No accessory muscle usage or respiratory distress.     Breath sounds: Normal breath sounds.  Chest:  Breasts:    Right: Normal.     Left: Normal.  Abdominal:     General: Bowel sounds are normal.     Palpations: Abdomen is soft. There is no hepatomegaly or splenomegaly.     Tenderness: There is no abdominal tenderness.  Musculoskeletal:        General: Normal range of motion.     Cervical back: Normal range of motion and neck supple.     Right lower leg: No edema.     Left lower leg: No edema.  Lymphadenopathy:     Head:     Right side of head: No submental, submandibular, tonsillar, preauricular or posterior auricular adenopathy.     Left side of head: No submental, submandibular, tonsillar, preauricular or posterior auricular adenopathy.     Cervical: No cervical adenopathy.     Upper Body:     Right upper body: No supraclavicular, axillary or pectoral adenopathy.     Left upper body: No supraclavicular, axillary or pectoral adenopathy.  Skin:    General: Skin is warm and dry.     Capillary Refill: Capillary refill takes less than 2 seconds.     Findings: No rash.  Neurological:     Mental Status: She is alert and oriented to person, place, and time.     Gait: Gait is intact.     Deep Tendon Reflexes: Reflexes are normal and symmetric.     Reflex Scores:      Brachioradialis reflexes are 2+ on the right side and 2+ on the left side.      Patellar reflexes are 2+ on the right side and 2+ on the left side. Psychiatric:        Attention and Perception: Attention normal.        Mood and Affect: Mood normal.        Speech: Speech normal.        Behavior: Behavior normal. Behavior is cooperative.        Thought Content: Thought content normal.        Judgment: Judgment normal.    Results for orders placed or performed  during the hospital encounter of 01/15/20  Respiratory Panel by RT PCR (Flu A&B, Covid) - Nasopharyngeal Swab   Specimen: Nasopharyngeal Swab  Result Value Ref Range   SARS Coronavirus 2 by RT PCR NEGATIVE NEGATIVE   Influenza A by PCR NEGATIVE NEGATIVE   Influenza B by PCR NEGATIVE NEGATIVE  OB RESULT CONSOLE Group B Strep  Result Value Ref Range   GBS Negative   CBC  Result Value Ref Range   WBC 11.0 (H) 4.0 - 10.5 K/uL   RBC 3.85 (L) 3.87 - 5.11 MIL/uL   Hemoglobin 10.2 (L) 12.0 - 15.0 g/dL   HCT 16.131.1 (L) 09.636.0 - 04.546.0 %   MCV 80.8 80.0 - 100.0 fL   MCH 26.5 26.0 - 34.0 pg   MCHC 32.8 30.0 - 36.0 g/dL   RDW 40.914.7 81.111.5 - 91.415.5 %   Platelets 264 150 - 400 K/uL   nRBC 0.0 0.0 - 0.2 %  Comprehensive metabolic panel  Result Value Ref Range   Sodium 136 135 - 145 mmol/L   Potassium 3.9 3.5 - 5.1 mmol/L   Chloride 105 98 -  111 mmol/L   CO2 22 22 - 32 mmol/L   Glucose, Bld 90 70 - 99 mg/dL   BUN 11 6 - 20 mg/dL   Creatinine, Ser 4.690.73 0.44 - 1.00 mg/dL   Calcium 9.4 8.9 - 62.910.3 mg/dL   Total Protein 6.1 (L) 6.5 - 8.1 g/dL   Albumin 2.6 (L) 3.5 - 5.0 g/dL   AST 18 15 - 41 U/L   ALT 19 0 - 44 U/L   Alkaline Phosphatase 136 (H) 38 - 126 U/L   Total Bilirubin 0.5 0.3 - 1.2 mg/dL   GFR, Estimated >52>60 >84>60 mL/min   Anion gap 9 5 - 15  Protein / creatinine ratio, urine  Result Value Ref Range   Creatinine, Urine 213 mg/dL   Total Protein, Urine 62 mg/dL   Protein Creatinine Ratio 0.29 (H) 0.00 - 0.15 mg/mg[Cre]  RPR  Result Value Ref Range   RPR Ser Ql NON REACTIVE NON REACTIVE  Urine Drug Screen, Qualitative ([facility] only)  Result Value Ref Range   Tricyclic, Ur Screen NONE DETECTED NONE DETECTED   Amphetamines, Ur Screen NONE DETECTED NONE DETECTED   MDMA (Ecstasy)Ur Screen NONE DETECTED NONE DETECTED   Cocaine Metabolite,Ur [place] NONE DETECTED NONE DETECTED   Opiate, Ur Screen NONE DETECTED NONE DETECTED   Phencyclidine (PCP) Ur S NONE DETECTED NONE DETECTED   Cannabinoid 50 Ng, Ur  [place] NONE DETECTED NONE DETECTED   Barbiturates, Ur Screen NONE DETECTED NONE DETECTED   Benzodiazepine, Ur Scrn NONE DETECTED NONE DETECTED   Methadone Scn, Ur NONE DETECTED NONE DETECTED  OB RESULTS CONSOLE GC/Chlamydia  Result Value Ref Range   Gonorrhea Negative    Chlamydia Negative   Glucose, capillary  Result Value Ref Range   Glucose-Capillary 87 70 - 99 mg/dL  Varicella zoster antibody, IgG  Result Value Ref Range   Varicella IgG 185 Immune >165 index  Glucose, capillary  Result Value Ref Range   Glucose-Capillary 98 70 - 99 mg/dL  Glucose, capillary  Result Value Ref Range   Glucose-Capillary 82 70 - 99 mg/dL  Glucose, capillary  Result Value Ref Range   Glucose-Capillary 82 70 - 99 mg/dL  Glucose, capillary  Result Value Ref Range   Glucose-Capillary 77 70 - 99 mg/dL  Glucose, capillary  Result Value Ref Range   Glucose-Capillary 96 70 - 99 mg/dL  CBC  Result Value Ref Range   WBC 11.3 (H) 4.0 - 10.5 K/uL   RBC 3.55 (L) 3.87 - 5.11 MIL/uL   Hemoglobin 9.2 (L) 12.0 - 15.0 g/dL   HCT 13.228.9 (L) 44.036.0 - 10.246.0 %   MCV 81.4 80.0 - 100.0 fL   MCH 25.9 (L) 26.0 - 34.0 pg   MCHC 31.8 30.0 - 36.0 g/dL   RDW 72.515.6 (H) 36.611.5 - 44.015.5 %   Platelets 196 150 - 400 K/uL   nRBC 0.0 0.0 - 0.2 %  Type and screen  Result Value Ref Range   ABO/RH(D) O POS    Antibody Screen NEG    Sample Expiration      01/18/2020,2359 Performed at [facility], [address] [place], [place] [address]   ABO/Rh  Result Value Ref Range   ABO/RH(D)      O POS Performed at [facility], [address] [place], [place] [address]       Assessment & Plan:   Problem List Items Addressed This Visit       Other   Depression  Chronic.  Controlled without medication.  Labs ordered today.  Return to clinic in 6 months for reevaluation.  Call sooner if concerns arise.        Anxiety    Chronic.  Controlled without medication.  Labs ordered today.  Return to clinic in 6  months for reevaluation.  Call sooner if concerns arise.        Other Visit Diagnoses     Annual physical exam    -  Primary   Health maintenance reviewed during visit.  Labs ordered during visit today. Declined vaccines.    Relevant Orders   CBC with Differential/Platelet   Comprehensive metabolic panel   Lipid panel   TSH   Urinalysis, Routine w reflex microscopic        Follow up plan: Return in about 1 year (around 04/22/2022) for Physical and Fasting labs.   LABORATORY TESTING:  - Pap smear: not applicable  IMMUNIZATIONS:   - Tdap: Tetanus vaccination status reviewed: last tetanus booster within 10 years. - Influenza: Refused - Pneumovax: Not applicable - Prevnar: Not applicable - COVID: Not applicable - HPV: Refused - Shingrix vaccine: Not applicable  SCREENING: -Mammogram: Not applicable  - Colonoscopy: Not applicable  - Bone Density: Not applicable  -Hearing Test: Not applicable  -Spirometry: Not applicable   PATIENT COUNSELING:   Advised to take 1 mg of folate supplement per day if capable of pregnancy.   Sexuality: Discussed sexually transmitted diseases, partner selection, use of condoms, avoidance of unintended pregnancy  and contraceptive alternatives.   Advised to avoid cigarette smoking.  I discussed with the patient that most people either abstain from alcohol or drink within safe limits (<=14/week and <=4 drinks/occasion for males, <=7/weeks and <= 3 drinks/occasion for females) and that the risk for alcohol disorders and other health effects rises proportionally with the number of drinks per week and how often a drinker exceeds daily limits.  Discussed cessation/primary prevention of drug use and availability of treatment for abuse.   Diet: Encouraged to adjust caloric intake to maintain  or achieve ideal body weight, to reduce intake of dietary saturated fat and total fat, to limit sodium intake by avoiding high sodium foods and not adding table  salt, and to maintain adequate dietary potassium and calcium preferably from fresh fruits, vegetables, and low-fat dairy products.    stressed the importance of regular exercise  Injury prevention: Discussed safety belts, safety helmets, smoke detector, smoking near bedding or upholstery.   Dental health: Discussed importance of regular tooth brushing, flossing, and dental visits.    NEXT PREVENTATIVE PHYSICAL DUE IN 1 YEAR. Return in about 1 year (around 04/22/2022) for Physical and Fasting labs.

## 2021-04-24 LAB — COMPREHENSIVE METABOLIC PANEL
ALT: 11 IU/L (ref 0–32)
AST: 16 IU/L (ref 0–40)
Albumin/Globulin Ratio: 1.8 (ref 1.2–2.2)
Albumin: 4.6 g/dL (ref 3.9–5.0)
Alkaline Phosphatase: 88 IU/L (ref 42–106)
BUN/Creatinine Ratio: 10 (ref 9–23)
BUN: 8 mg/dL (ref 6–20)
Bilirubin Total: 0.5 mg/dL (ref 0.0–1.2)
CO2: 17 mmol/L — ABNORMAL LOW (ref 20–29)
Calcium: 9.6 mg/dL (ref 8.7–10.2)
Chloride: 108 mmol/L — ABNORMAL HIGH (ref 96–106)
Creatinine, Ser: 0.8 mg/dL (ref 0.57–1.00)
Globulin, Total: 2.5 g/dL (ref 1.5–4.5)
Glucose: 95 mg/dL (ref 70–99)
Potassium: 4.4 mmol/L (ref 3.5–5.2)
Sodium: 144 mmol/L (ref 134–144)
Total Protein: 7.1 g/dL (ref 6.0–8.5)
eGFR: 108 mL/min/{1.73_m2} (ref >59)

## 2021-04-24 LAB — CBC WITH DIFFERENTIAL/PLATELET
Basophils Absolute: 0 10*3/uL (ref 0.0–0.2)
Basos: 1 %
EOS (ABSOLUTE): 0.2 10*3/uL (ref 0.0–0.4)
Eos: 2 %
Hematocrit: 42.7 % (ref 34.0–46.6)
Hemoglobin: 13.7 g/dL (ref 11.1–15.9)
Immature Grans (Abs): 0 10*3/uL (ref 0.0–0.1)
Immature Granulocytes: 0 %
Lymphocytes Absolute: 2.6 10*3/uL (ref 0.7–3.1)
Lymphs: 34 %
MCH: 28.2 pg (ref 26.6–33.0)
MCHC: 32.1 g/dL (ref 31.5–35.7)
MCV: 88 fL (ref 79–97)
Monocytes Absolute: 0.4 10*3/uL (ref 0.1–0.9)
Monocytes: 5 %
Neutrophils Absolute: 4.5 10*3/uL (ref 1.4–7.0)
Neutrophils: 58 %
Platelets: 200 10*3/uL (ref 150–450)
RBC: 4.85 x10E6/uL (ref 3.77–5.28)
RDW: 12.7 % (ref 11.7–15.4)
WBC: 7.8 10*3/uL (ref 3.4–10.8)

## 2021-04-24 LAB — LIPID PANEL
Chol/HDL Ratio: 4.4 ratio (ref 0.0–4.4)
Cholesterol, Total: 169 mg/dL (ref 100–199)
HDL: 38 mg/dL — ABNORMAL LOW (ref >39)
LDL Chol Calc (NIH): 114 mg/dL — ABNORMAL HIGH (ref 0–99)
Triglycerides: 90 mg/dL (ref 0–149)
VLDL Cholesterol Cal: 17 mg/dL (ref 5–40)

## 2021-04-24 LAB — TSH: TSH: 2.05 u[IU]/mL (ref 0.450–4.500)

## 2021-04-26 NOTE — Progress Notes (Signed)
Hi Penny Wilson.  It was nice to see you last week.  Your lab work looks good.  Your cholesterol is elevated.  I recommend you follow a low fat diet and exercise for at least 30 mins 5x weekly.  Continue with your current medication regimen.  Follow up as discussed.  Please let me know if you have any questions.

## 2022-04-25 ENCOUNTER — Encounter: Payer: Medicaid Other | Admitting: Nurse Practitioner

## 2023-08-11 ENCOUNTER — Ambulatory Visit (INDEPENDENT_AMBULATORY_CARE_PROVIDER_SITE_OTHER): Admitting: Nurse Practitioner

## 2023-08-11 ENCOUNTER — Encounter: Payer: Self-pay | Admitting: Nurse Practitioner

## 2023-08-11 VITALS — BP 116/84 | HR 87 | Temp 98.4°F | Resp 15 | Ht 61.5 in | Wt 262.2 lb

## 2023-08-11 DIAGNOSIS — Z0000 Encounter for general adult medical examination without abnormal findings: Secondary | ICD-10-CM

## 2023-08-11 DIAGNOSIS — Z136 Encounter for screening for cardiovascular disorders: Secondary | ICD-10-CM | POA: Diagnosis not present

## 2023-08-11 DIAGNOSIS — R7309 Other abnormal glucose: Secondary | ICD-10-CM

## 2023-08-11 DIAGNOSIS — Z118 Encounter for screening for other infectious and parasitic diseases: Secondary | ICD-10-CM

## 2023-08-11 NOTE — Progress Notes (Signed)
 BP 116/84 (BP Location: Left Arm, Patient Position: Sitting, Cuff Size: Large)   Pulse 87   Temp 98.4 F (36.9 C) (Oral)   Resp 15   Ht 5' 1.5" (1.562 m)   Wt 262 lb 3.2 oz (118.9 kg)   LMP  (LMP Unknown)   SpO2 98%   BMI 48.75 kg/m    Subjective:    Patient ID: Penny Wilson, female    DOB: 2000/09/10, 23 y.o.   MRN: [id number]  HPI: Penny Wilson is a 23 y.o. female presenting on 08/11/2023 for comprehensive medical examination. Current medical complaints include:elevated glucose  She currently lives with: Menopausal Symptoms: no  Patient has history of gestational diabetes.  Checked her sugars yesterday and it was 240.  Would like an A1c checked today.  Denies polyuria or polydipsia.  Depression Screen done today and results listed below:     04/22/2021   10:34 AM 03/17/2021    1:20 PM 10/23/2019    9:36 AM 07/17/2019    3:22 PM  Depression screen PHQ 2/9  Decreased Interest 1 1 1 2   Down, Depressed, Hopeless 0 3 1 2   PHQ - 2 Score 1 4 2 4   Altered sleeping 0 0 0 2  Tired, decreased energy 1 1 3 3   Change in appetite 0 2 0 2  Feeling bad or failure about yourself  0 3 0 0  Trouble concentrating 1 0 0 0  Moving slowly or fidgety/restless 0 1 0 0  Suicidal thoughts 0 0 0 0  PHQ-9 Score 3 11 5 11   Difficult doing work/chores Not difficult at all Very difficult Not difficult at all Somewhat difficult    The patient does not have a history of falls. I did complete a risk assessment for falls. A plan of care for falls was documented.   Past Medical History:  Past Medical History:  Diagnosis Date   ADHD    Anxiety    Depression    Gestational diabetes    Hypertension    Pregnancy induced hypertension     Surgical History:  Past Surgical History:  Procedure Laterality Date   NO PAST SURGERIES      Medications:  Current Outpatient Medications on File Prior to Visit  Medication Sig   levonorgestrel (MIRENA) 20 MCG/DAY IUD 1 each by Intrauterine route once.    No current facility-administered medications on file prior to visit.    Allergies:  No Known Allergies  Social History:  Social History   Socioeconomic History   Marital status: Single    Spouse name: Not on file   Number of children: 1   Years of education: Not on file   Highest education level: Not on file  Occupational History   Not on file  Tobacco Use   Smoking status: Never   Smokeless tobacco: Never  Vaping Use   Vaping status: Former  Substance and Sexual Activity   Alcohol use: Never   Drug use: Never   Sexual activity: Not Currently  Other Topics Concern   Not on file  Social History Narrative   Not on file   Social Drivers of Health   Financial Resource Strain: Low Risk  (10/23/2019)   Overall Financial Resource Strain (CARDIA)    Difficulty of Paying Living Expenses: Not hard at all  Food Insecurity: No Food Insecurity (10/23/2019)   Hunger Vital Sign    Worried About Running Out of Food in the Last Year: Never true  Ran Out of Food in the Last Year: Never true  Transportation Needs: No Transportation Needs (10/23/2019)   PRAPARE - Administrator, Civil Service (Medical): No    Lack of Transportation (Non-Medical): No  Physical Activity: Insufficiently Active (10/23/2019)   Exercise Vital Sign    Days of Exercise per Week: 2 days    Minutes of Exercise per Session: 20 min  Stress: Stress Concern Present (10/23/2019)   [employer] of Occupational Health - Occupational Stress Questionnaire    Feeling of Stress : To some extent  Social Connections: Socially Isolated (08/11/2023)   Social Connection and Isolation Panel [NHANES]    Frequency of Communication with Friends and Family: Three times a week    Frequency of Social Gatherings with Friends and Family: Once a week    Attends Religious Services: Never    Database administrator or Organizations: No    Attends Banker Meetings: Never    Marital Status: Never married   Intimate Partner Violence: Not At Risk (10/23/2019)   Humiliation, Afraid, Rape, and Kick questionnaire    Fear of Current or Ex-Partner: No    Emotionally Abused: No    Physically Abused: No    Sexually Abused: No   Social History   Tobacco Use  Smoking Status Never  Smokeless Tobacco Never   Social History   Substance and Sexual Activity  Alcohol Use Never    Family History:  Family History  Problem Relation Age of Onset   Narcolepsy Mother    Epilepsy Mother    Narcolepsy Maternal Grandfather     Past medical history, surgical history, medications, allergies, family history and social history reviewed with patient today and changes made to appropriate areas of the chart.   Review of Systems  Endo/Heme/Allergies:  Negative for polydipsia.       Polydipsia   All other ROS negative except what is listed above and in the HPI.      Objective:     BP 116/84 (BP Location: Left Arm, Patient Position: Sitting, Cuff Size: Large)   Pulse 87   Temp 98.4 F (36.9 C) (Oral)   Resp 15   Ht 5' 1.5" (1.562 m)   Wt 262 lb 3.2 oz (118.9 kg)   LMP  (LMP Unknown)   SpO2 98%   BMI 48.75 kg/m   Wt Readings from Last 3 Encounters:  08/11/23 262 lb 3.2 oz (118.9 kg)  04/22/21 222 lb 3.2 oz (100.8 kg)  03/17/21 223 lb 12.8 oz (101.5 kg)    Physical Exam Vitals and nursing note reviewed.  Constitutional:      General: She is awake. She is not in acute distress.    Appearance: Normal appearance. She is well-developed. She is obese. She is not ill-appearing.  HENT:     Head: Normocephalic and atraumatic.     Right Ear: Hearing, tympanic membrane, ear canal and external ear normal. No drainage.     Left Ear: Hearing, tympanic membrane, ear canal and external ear normal. No drainage.     Nose: Nose normal.     Right Sinus: No maxillary sinus tenderness or frontal sinus tenderness.     Left Sinus: No maxillary sinus tenderness or frontal sinus tenderness.     Mouth/Throat:      Mouth: Mucous membranes are moist.     Pharynx: Oropharynx is clear. Uvula midline. No pharyngeal swelling, oropharyngeal exudate or posterior oropharyngeal erythema.  Eyes:     General:  Lids are normal.        Right eye: No discharge.        Left eye: No discharge.     Extraocular Movements: Extraocular movements intact.     Conjunctiva/sclera: Conjunctivae normal.     Pupils: Pupils are equal, round, and reactive to light.     Visual Fields: Right eye visual fields normal and left eye visual fields normal.  Neck:     Thyroid: No thyromegaly.     Vascular: No carotid bruit.     Trachea: Trachea normal.  Cardiovascular:     Rate and Rhythm: Normal rate and regular rhythm.     Heart sounds: Normal heart sounds. No murmur heard.    No gallop.  Pulmonary:     Effort: Pulmonary effort is normal. No accessory muscle usage or respiratory distress.     Breath sounds: Normal breath sounds.  Chest:  Breasts:    Right: Normal.     Left: Normal.  Abdominal:     General: Bowel sounds are normal.     Palpations: Abdomen is soft. There is no hepatomegaly or splenomegaly.     Tenderness: There is no abdominal tenderness.  Musculoskeletal:        General: Normal range of motion.     Cervical back: Normal range of motion and neck supple.     Right lower leg: No edema.     Left lower leg: No edema.  Lymphadenopathy:     Head:     Right side of head: No submental, submandibular, tonsillar, preauricular or posterior auricular adenopathy.     Left side of head: No submental, submandibular, tonsillar, preauricular or posterior auricular adenopathy.     Cervical: No cervical adenopathy.     Upper Body:     Right upper body: No supraclavicular, axillary or pectoral adenopathy.     Left upper body: No supraclavicular, axillary or pectoral adenopathy.  Skin:    General: Skin is warm and dry.     Capillary Refill: Capillary refill takes less than 2 seconds.     Findings: No rash.  Neurological:      Mental Status: She is alert and oriented to person, place, and time.     Gait: Gait is intact.  Psychiatric:        Attention and Perception: Attention normal.        Mood and Affect: Mood normal.        Speech: Speech normal.        Behavior: Behavior normal. Behavior is cooperative.        Thought Content: Thought content normal.        Judgment: Judgment normal.     Results for orders placed or performed in visit on 04/22/21  Microscopic Examination   Collection Time: 04/22/21 11:02 AM   Urine  Result Value Ref Range   WBC, UA None seen 0 - 5 /hpf   RBC, Urine 3-10 (A) 0 - 2 /hpf   Epithelial Cells (non renal) 0-10 0 - 10 /hpf   Mucus, UA Present (A) Not Estab.   Bacteria, UA Moderate (A) None seen/Few  Urinalysis, Routine w reflex microscopic   Collection Time: 04/22/21 11:02 AM  Result Value Ref Range   Specific Gravity, UA >1.030 (H) 1.005 - 1.030   pH, UA 6.0 5.0 - 7.5   Color, UA Yellow Yellow   Appearance Ur Cloudy (A) Clear   Leukocytes,UA Negative Negative   Protein,UA 1+ (A) Negative/Trace   Glucose,  UA Negative Negative   Ketones, UA Negative Negative   RBC, UA 3+ (A) Negative   Bilirubin, UA Negative Negative   Urobilinogen, Ur 0.2 0.2 - 1.0 mg/dL   Nitrite, UA Negative Negative   Microscopic Examination See below:   CBC with Differential/Platelet   Collection Time: 04/22/21 11:05 AM  Result Value Ref Range   WBC 7.8 3.4 - 10.8 x10E3/uL   RBC 4.85 3.77 - 5.28 x10E6/uL   Hemoglobin 13.7 11.1 - 15.9 g/dL   Hematocrit 09.8 11.9 - 46.6 %   MCV 88 79 - 97 fL   MCH 28.2 26.6 - 33.0 pg   MCHC 32.1 31.5 - 35.7 g/dL   RDW 14.7 82.9 - 56.2 %   Platelets 200 150 - 450 x10E3/uL   Neutrophils 58 Not Estab. %   Lymphs 34 Not Estab. %   Monocytes 5 Not Estab. %   Eos 2 Not Estab. %   Basos 1 Not Estab. %   Neutrophils Absolute 4.5 1.4 - 7.0 x10E3/uL   Lymphocytes Absolute 2.6 0.7 - 3.1 x10E3/uL   Monocytes Absolute 0.4 0.1 - 0.9 x10E3/uL   EOS (ABSOLUTE) 0.2  0.0 - 0.4 x10E3/uL   Basophils Absolute 0.0 0.0 - 0.2 x10E3/uL   Immature Granulocytes 0 Not Estab. %   Immature Grans (Abs) 0.0 0.0 - 0.1 x10E3/uL  Comprehensive metabolic panel   Collection Time: 04/22/21 11:05 AM  Result Value Ref Range   Glucose 95 70 - 99 mg/dL   BUN 8 6 - 20 mg/dL   Creatinine, Ser 1.30 0.57 - 1.00 mg/dL   eGFR 865 >78 IO/NGE/9.52   BUN/Creatinine Ratio 10 9 - 23   Sodium 144 134 - 144 mmol/L   Potassium 4.4 3.5 - 5.2 mmol/L   Chloride 108 (H) 96 - 106 mmol/L   CO2 17 (L) 20 - 29 mmol/L   Calcium 9.6 8.7 - 10.2 mg/dL   Total Protein 7.1 6.0 - 8.5 g/dL   Albumin 4.6 3.9 - 5.0 g/dL   Globulin, Total 2.5 1.5 - 4.5 g/dL   Albumin/Globulin Ratio 1.8 1.2 - 2.2   Bilirubin Total 0.5 0.0 - 1.2 mg/dL   Alkaline Phosphatase 88 42 - 106 IU/L   AST 16 0 - 40 IU/L   ALT 11 0 - 32 IU/L  Lipid panel   Collection Time: 04/22/21 11:05 AM  Result Value Ref Range   Cholesterol, Total 169 100 - 199 mg/dL   Triglycerides 90 0 - 149 mg/dL   HDL 38 (L) >84 mg/dL   VLDL Cholesterol Cal 17 5 - 40 mg/dL   LDL Chol Calc (NIH) 132 (H) 0 - 99 mg/dL   Chol/HDL Ratio 4.4 0.0 - 4.4 ratio  TSH   Collection Time: 04/22/21 11:05 AM  Result Value Ref Range   TSH 2.050 0.450 - 4.500 uIU/mL      Assessment & Plan:   Problem List Items Addressed This Visit   None Visit Diagnoses       Annual physical exam    -  Primary   Health maintenance reviewed during visit today.  Labs ordered.  Vaccines reviewed.  PAP up to date.   Relevant Orders   CBC with Differential/Platelet   Comprehensive metabolic panel with GFR   Lipid panel   TSH     Screening for ischemic heart disease       Relevant Orders   Lipid panel     Screening for chlamydial disease  Relevant Orders   Chlamydia/Gonococcus/Trichomonas, NAA([facility])     Elevated glucose       Labs ordered at visit today.  Will make recommendations based on results.   Relevant Orders   HgB A1c        Follow up  plan: Return in about 1 year (around 08/10/2024) for Physical and Fasting labs.   LABORATORY TESTING:  - Pap smear: up to date  IMMUNIZATIONS:   - Tdap: Tetanus vaccination status reviewed: last tetanus booster within 10 years. - Influenza: Postponed to flu season - Pneumovax: Not applicable - Prevnar: Not applicable - COVID: Not applicable - HPV: declined second shot - Shingrix vaccine: Not applicable - Declined second meningococcal   SCREENING: -Mammogram: Not applicable  - Colonoscopy: Not applicable  - Bone Density: Not applicable  -Hearing Test: Not applicable  -Spirometry: Not applicable   PATIENT COUNSELING:   Advised to take 1 mg of folate supplement per day if capable of pregnancy.   Sexuality: Discussed sexually transmitted diseases, partner selection, use of condoms, avoidance of unintended pregnancy  and contraceptive alternatives.   Advised to avoid cigarette smoking.  I discussed with the patient that most people either abstain from alcohol or drink within safe limits (<=14/week and <=4 drinks/occasion for males, <=7/weeks and <= 3 drinks/occasion for females) and that the risk for alcohol disorders and other health effects rises proportionally with the number of drinks per week and how often a drinker exceeds daily limits.  Discussed cessation/primary prevention of drug use and availability of treatment for abuse.   Diet: Encouraged to adjust caloric intake to maintain  or achieve ideal body weight, to reduce intake of dietary saturated fat and total fat, to limit sodium intake by avoiding high sodium foods and not adding table salt, and to maintain adequate dietary potassium and calcium preferably from fresh fruits, vegetables, and low-fat dairy products.    stressed the importance of regular exercise  Injury prevention: Discussed safety belts, safety helmets, smoke detector, smoking near bedding or upholstery.   Dental health: Discussed importance of regular  tooth brushing, flossing, and dental visits.    NEXT PREVENTATIVE PHYSICAL DUE IN 1 YEAR. Return in about 1 year (around 08/10/2024) for Physical and Fasting labs.

## 2023-08-12 LAB — COMPREHENSIVE METABOLIC PANEL WITH GFR
ALT: 18 IU/L (ref 0–32)
AST: 16 IU/L (ref 0–40)
Albumin: 4.3 g/dL (ref 4.0–5.0)
Alkaline Phosphatase: 87 IU/L (ref 44–121)
BUN/Creatinine Ratio: 14 (ref 9–23)
BUN: 11 mg/dL (ref 6–20)
Bilirubin Total: 0.4 mg/dL (ref 0.0–1.2)
CO2: 19 mmol/L — ABNORMAL LOW (ref 20–29)
Calcium: 9.6 mg/dL (ref 8.7–10.2)
Chloride: 103 mmol/L (ref 96–106)
Creatinine, Ser: 0.8 mg/dL (ref 0.57–1.00)
Globulin, Total: 2.3 g/dL (ref 1.5–4.5)
Glucose: 180 mg/dL — ABNORMAL HIGH (ref 70–99)
Potassium: 4.4 mmol/L (ref 3.5–5.2)
Sodium: 136 mmol/L (ref 134–144)
Total Protein: 6.6 g/dL (ref 6.0–8.5)
eGFR: 107 mL/min/{1.73_m2} (ref >59)

## 2023-08-12 LAB — LIPID PANEL
Chol/HDL Ratio: 4.9 ratio — ABNORMAL HIGH (ref 0.0–4.4)
Cholesterol, Total: 187 mg/dL (ref 100–199)
HDL: 38 mg/dL — ABNORMAL LOW (ref >39)
LDL Chol Calc (NIH): 128 mg/dL — ABNORMAL HIGH (ref 0–99)
Triglycerides: 113 mg/dL (ref 0–149)
VLDL Cholesterol Cal: 21 mg/dL (ref 5–40)

## 2023-08-12 LAB — CBC WITH DIFFERENTIAL/PLATELET
Basophils Absolute: 0.1 10*3/uL (ref 0.0–0.2)
Basos: 1 %
EOS (ABSOLUTE): 0.3 10*3/uL (ref 0.0–0.4)
Eos: 3 %
Hematocrit: 42.4 % (ref 34.0–46.6)
Hemoglobin: 14 g/dL (ref 11.1–15.9)
Immature Grans (Abs): 0 10*3/uL (ref 0.0–0.1)
Immature Granulocytes: 0 %
Lymphocytes Absolute: 3 10*3/uL (ref 0.7–3.1)
Lymphs: 29 %
MCH: 29.9 pg (ref 26.6–33.0)
MCHC: 33 g/dL (ref 31.5–35.7)
MCV: 91 fL (ref 79–97)
Monocytes Absolute: 0.5 10*3/uL (ref 0.1–0.9)
Monocytes: 5 %
Neutrophils Absolute: 6.5 10*3/uL (ref 1.4–7.0)
Neutrophils: 62 %
Platelets: 330 10*3/uL (ref 150–450)
RBC: 4.68 x10E6/uL (ref 3.77–5.28)
RDW: 12 % (ref 11.7–15.4)
WBC: 10.3 10*3/uL (ref 3.4–10.8)

## 2023-08-12 LAB — HEMOGLOBIN A1C
Est. average glucose Bld gHb Est-mCnc: 157 mg/dL
Hgb A1c MFr Bld: 7.1 % — ABNORMAL HIGH (ref 4.8–5.6)

## 2023-08-12 LAB — TSH: TSH: 2.57 u[IU]/mL (ref 0.450–4.500)

## 2023-08-13 LAB — CHLAMYDIA/GONOCOCCUS/TRICHOMONAS, NAA
Chlamydia by NAA: NEGATIVE
Gonococcus by NAA: NEGATIVE
Trich vag by NAA: NEGATIVE

## 2023-08-14 ENCOUNTER — Ambulatory Visit: Payer: Self-pay | Admitting: Nurse Practitioner

## 2023-08-22 ENCOUNTER — Ambulatory Visit: Admitting: Nurse Practitioner

## 2023-08-22 ENCOUNTER — Encounter: Payer: Self-pay | Admitting: Nurse Practitioner

## 2023-08-22 VITALS — BP 110/70 | Ht 61.5 in | Wt 260.0 lb

## 2023-08-22 DIAGNOSIS — E119 Type 2 diabetes mellitus without complications: Secondary | ICD-10-CM | POA: Diagnosis not present

## 2023-08-22 MED ORDER — ACCU-CHEK SOFTCLIX LANCETS MISC
1.0000 | Freq: Two times a day (BID) | 3 refills | Status: AC
Start: 2023-08-22 — End: ?

## 2023-08-22 MED ORDER — LISINOPRIL 2.5 MG PO TABS: 2.5000 mg | ORAL_TABLET | Freq: Every day | ORAL | 1 refills | Status: DC

## 2023-08-22 MED ORDER — ROSUVASTATIN CALCIUM 5 MG PO TABS: 5.0000 mg | ORAL_TABLET | Freq: Every day | ORAL | 1 refills | Status: DC

## 2023-08-22 MED ORDER — ACCU-CHEK AVIVA PLUS W/DEVICE KIT
1.0000 | PACK | Freq: Every day | 0 refills | Status: AC
Start: 2023-08-22 — End: ?

## 2023-08-22 MED ORDER — ACCU-CHEK AVIVA PLUS VI STRP
1.0000 | ORAL_STRIP | Freq: Two times a day (BID) | 3 refills | Status: AC
Start: 2023-08-22 — End: ?

## 2023-08-22 NOTE — Progress Notes (Signed)
 BP 110/70   Ht 5' 1.5" (1.562 m)   Wt 260 lb (117.9 kg)   LMP  (LMP Unknown)   BMI 48.33 kg/m    Subjective:    Patient ID: Penny Wilson, female    DOB: [birth year], 23 y.o.   MRN: [id number]  HPI: Penny Wilson is a 23 y.o. female  Chief Complaint  Patient presents with   Medical Management of Chronic Issues   DIABETES Patient presents to clinic to discuss her new diagnosis of diabetes.  Patient has been checking her blood sugar at home and it has been in the 180s.  Here to discuss medications today.  Has been following a low carb diet.    Relevant past medical, surgical, family and social history reviewed and updated as indicated. Interim medical history since our last visit reviewed. Allergies and medications reviewed and updated.  Review of Systems  All other systems reviewed and are negative.   Per HPI unless specifically indicated above     Objective:     BP 110/70   Ht 5' 1.5" (1.562 m)   Wt 260 lb (117.9 kg)   LMP  (LMP Unknown)   BMI 48.33 kg/m   Wt Readings from Last 3 Encounters:  08/22/23 260 lb (117.9 kg)  08/11/23 262 lb 3.2 oz (118.9 kg)  04/22/21 222 lb 3.2 oz (100.8 kg)    Physical Exam Vitals and nursing note reviewed.  Constitutional:      General: She is not in acute distress.    Appearance: Normal appearance. She is obese. She is not ill-appearing, toxic-appearing or diaphoretic.  HENT:     Head: Normocephalic.     Right Ear: External ear normal.     Left Ear: External ear normal.     Nose: Nose normal.     Mouth/Throat:     Mouth: Mucous membranes are moist.     Pharynx: Oropharynx is clear.  Eyes:     General:        Right eye: No discharge.        Left eye: No discharge.     Extraocular Movements: Extraocular movements intact.     Conjunctiva/sclera: Conjunctivae normal.     Pupils: Pupils are equal, round, and reactive to light.  Cardiovascular:     Rate and Rhythm: Normal rate and regular rhythm.     Heart sounds: No  murmur heard. Pulmonary:     Effort: Pulmonary effort is normal. No respiratory distress.     Breath sounds: Normal breath sounds. No wheezing or rales.  Musculoskeletal:     Cervical back: Normal range of motion and neck supple.  Skin:    General: Skin is warm and dry.     Capillary Refill: Capillary refill takes less than 2 seconds.  Neurological:     General: No focal deficit present.     Mental Status: She is alert and oriented to person, place, and time. Mental status is at baseline.  Psychiatric:        Mood and Affect: Mood normal.        Behavior: Behavior normal.        Thought Content: Thought content normal.        Judgment: Judgment normal.     Results for orders placed or performed in visit on 08/11/23  Chlamydia/Gonococcus/Trichomonas, NAA([facility])   Collection Time: 08/11/23 11:25 AM   Specimen: Urine   UR  Result Value Ref Range   Chlamydia by NAA Negative Negative  Gonococcus by NAA Negative Negative   Trich vag by NAA Negative Negative  CBC with Differential/Platelet   Collection Time: 08/11/23 11:25 AM  Result Value Ref Range   WBC 10.3 3.4 - 10.8 x10E3/uL   RBC 4.68 3.77 - 5.28 x10E6/uL   Hemoglobin 14.0 11.1 - 15.9 g/dL   Hematocrit 13.0 86.5 - 46.6 %   MCV 91 79 - 97 fL   MCH 29.9 26.6 - 33.0 pg   MCHC 33.0 31.5 - 35.7 g/dL   RDW 78.4 69.6 - 29.5 %   Platelets 330 150 - 450 x10E3/uL   Neutrophils 62 Not Estab. %   Lymphs 29 Not Estab. %   Monocytes 5 Not Estab. %   Eos 3 Not Estab. %   Basos 1 Not Estab. %   Neutrophils Absolute 6.5 1.4 - 7.0 x10E3/uL   Lymphocytes Absolute 3.0 0.7 - 3.1 x10E3/uL   Monocytes Absolute 0.5 0.1 - 0.9 x10E3/uL   EOS (ABSOLUTE) 0.3 0.0 - 0.4 x10E3/uL   Basophils Absolute 0.1 0.0 - 0.2 x10E3/uL   Immature Granulocytes 0 Not Estab. %   Immature Grans (Abs) 0.0 0.0 - 0.1 x10E3/uL  Comprehensive metabolic panel with GFR   Collection Time: 08/11/23 11:25 AM  Result Value Ref Range   Glucose 180 (H) 70 - 99 mg/dL    BUN 11 6 - 20 mg/dL   Creatinine, Ser 2.84 0.57 - 1.00 mg/dL   eGFR 132 >44 WN/UUV/2.53   BUN/Creatinine Ratio 14 9 - 23   Sodium 136 134 - 144 mmol/L   Potassium 4.4 3.5 - 5.2 mmol/L   Chloride 103 96 - 106 mmol/L   CO2 19 (L) 20 - 29 mmol/L   Calcium 9.6 8.7 - 10.2 mg/dL   Total Protein 6.6 6.0 - 8.5 g/dL   Albumin 4.3 4.0 - 5.0 g/dL   Globulin, Total 2.3 1.5 - 4.5 g/dL   Bilirubin Total 0.4 0.0 - 1.2 mg/dL   Alkaline Phosphatase 87 44 - 121 IU/L   AST 16 0 - 40 IU/L   ALT 18 0 - 32 IU/L  Lipid panel   Collection Time: 08/11/23 11:25 AM  Result Value Ref Range   Cholesterol, Total 187 100 - 199 mg/dL   Triglycerides 664 0 - 149 mg/dL   HDL 38 (L) >40 mg/dL   VLDL Cholesterol Cal 21 5 - 40 mg/dL   LDL Chol Calc (NIH) 347 (H) 0 - 99 mg/dL   Chol/HDL Ratio 4.9 (H) 0.0 - 4.4 ratio  TSH   Collection Time: 08/11/23 11:25 AM  Result Value Ref Range   TSH 2.570 0.450 - 4.500 uIU/mL  HgB A1c   Collection Time: 08/11/23 11:25 AM  Result Value Ref Range   Hgb A1c MFr Bld 7.1 (H) 4.8 - 5.6 %   Est. average glucose Bld gHb Est-mCnc 157 mg/dL      Assessment & Plan:   Problem List Items Addressed This Visit       Endocrine   Controlled type 2 diabetes mellitus without complication, without long-term current use of insulin (HCC) - Primary   New diagnosis.  A1c at last visit was 7.0%.  Will start Lisinopril 2.5mg  daily and Rosuvastatin 5mg  daily.  Side effects and benefits of medication discussed.  Discussed starting Ozempic with patient during visit.  She would like to discuss with her family before starting the medication.  Would like diabetic testing supplies.  She will let me know if she decides to start Ozempic.  Advised  patient that these medications can not be taken if she plans to get pregnant or gets pregnant.  Follow up in 3 months.  Call sooner if concerns arise.      Relevant Medications   lisinopril (ZESTRIL) 2.5 MG tablet   rosuvastatin (CRESTOR) 5 MG tablet   Blood  Glucose Monitoring Suppl (ACCU-CHEK AVIVA PLUS) w/Device KIT   Accu-Chek Softclix Lancets lancets   glucose blood (ACCU-CHEK AVIVA PLUS) test strip     Follow up plan: Return in about 3 months (around 11/22/2023) for HTN, HLD, DM2 FU.

## 2023-08-22 NOTE — Assessment & Plan Note (Addendum)
 New diagnosis.  A1c at last visit was 7.0%.  Will start Lisinopril 2.5mg  daily and Rosuvastatin 5mg  daily.  Side effects and benefits of medication discussed.  Discussed starting Ozempic with patient during visit.  She would like to discuss with her family before starting the medication.  Would like diabetic testing supplies.  She will let me know if she decides to start Ozempic.  Advised patient that these medications can not be taken if she plans to get pregnant or gets pregnant.  Follow up in 3 months.  Call sooner if concerns arise.

## 2023-09-01 ENCOUNTER — Encounter: Payer: Self-pay | Admitting: Nurse Practitioner

## 2023-09-05 ENCOUNTER — Encounter: Payer: Self-pay | Admitting: Nurse Practitioner

## 2023-09-05 ENCOUNTER — Telehealth: Admitting: Nurse Practitioner

## 2023-09-05 DIAGNOSIS — E119 Type 2 diabetes mellitus without complications: Secondary | ICD-10-CM | POA: Diagnosis not present

## 2023-09-05 DIAGNOSIS — Z7984 Long term (current) use of oral hypoglycemic drugs: Secondary | ICD-10-CM | POA: Diagnosis not present

## 2023-09-05 MED ORDER — LOSARTAN POTASSIUM 25 MG PO TABS: 25.0000 mg | ORAL_TABLET | Freq: Every day | ORAL | 0 refills | Status: DC

## 2023-09-05 MED ORDER — METFORMIN HCL 500 MG PO TABS: 500.0000 mg | ORAL_TABLET | Freq: Two times a day (BID) | ORAL | 0 refills | Status: DC

## 2023-09-05 NOTE — Assessment & Plan Note (Signed)
 Chronic.  Controlled.  Patient has decided to start Metformin.  Will start 500mg  BID.  Side effects and benefits of medication discussed.  Patient would also like to change from Lisinopril  to Losartan- advised about possible low blood pressures.  Patient aware of risk and okay with taking medication.  Follow up in 3 months.

## 2023-09-05 NOTE — Progress Notes (Signed)
 LMP  (LMP Unknown)    Subjective:    Patient ID: Penny Wilson, female    DOB: January 16, 2001, 23 y.o.   MRN: [id number]  HPI: Penny Wilson is a 23 y.o. female  Chief Complaint  Patient presents with   Obesity   DIABETES Patient presents to clinic to discuss her new diagnosis of diabetes.  She did some research and has decided she would like to take metformin.  Patient states she has been checking her sugars and they are running 100-150.    She would also like to change from Lisinopril  to Losartan.  Other family members are taking this medication and she feels like it would be a better fit for her.    Relevant past medical, surgical, family and social history reviewed and updated as indicated. Interim medical history since our last visit reviewed. Allergies and medications reviewed and updated.  Review of Systems  All other systems reviewed and are negative.   Per HPI unless specifically indicated above     Objective:     LMP  (LMP Unknown)   Wt Readings from Last 3 Encounters:  08/22/23 260 lb (117.9 kg)  08/11/23 262 lb 3.2 oz (118.9 kg)  04/22/21 222 lb 3.2 oz (100.8 kg)    Physical Exam Vitals and nursing note reviewed.  HENT:     Head: Normocephalic.     Right Ear: Hearing normal.     Left Ear: Hearing normal.     Nose: Nose normal.   Eyes:     Pupils: Pupils are equal, round, and reactive to light.   Pulmonary:     Effort: Pulmonary effort is normal. No respiratory distress.   Neurological:     Mental Status: She is alert.   Psychiatric:        Mood and Affect: Mood normal.        Behavior: Behavior normal.        Thought Content: Thought content normal.        Judgment: Judgment normal.     Results for orders placed or performed in visit on 08/11/23  Chlamydia/Gonococcus/Trichomonas, NAA([facility])   Collection Time: 08/11/23 11:25 AM   Specimen: Urine   UR  Result Value Ref Range   Chlamydia by NAA Negative Negative   Gonococcus by NAA  Negative Negative   Trich vag by NAA Negative Negative  CBC with Differential/Platelet   Collection Time: 08/11/23 11:25 AM  Result Value Ref Range   WBC 10.3 3.4 - 10.8 x10E3/uL   RBC 4.68 3.77 - 5.28 x10E6/uL   Hemoglobin 14.0 11.1 - 15.9 g/dL   Hematocrit 57.5 65.9 - 46.6 %   MCV 91 79 - 97 fL   MCH 29.9 26.6 - 33.0 pg   MCHC 33.0 31.5 - 35.7 g/dL   RDW 87.9 88.2 - 84.5 %   Platelets 330 150 - 450 x10E3/uL   Neutrophils 62 Not Estab. %   Lymphs 29 Not Estab. %   Monocytes 5 Not Estab. %   Eos 3 Not Estab. %   Basos 1 Not Estab. %   Neutrophils Absolute 6.5 1.4 - 7.0 x10E3/uL   Lymphocytes Absolute 3.0 0.7 - 3.1 x10E3/uL   Monocytes Absolute 0.5 0.1 - 0.9 x10E3/uL   EOS (ABSOLUTE) 0.3 0.0 - 0.4 x10E3/uL   Basophils Absolute 0.1 0.0 - 0.2 x10E3/uL   Immature Granulocytes 0 Not Estab. %   Immature Grans (Abs) 0.0 0.0 - 0.1 x10E3/uL  Comprehensive metabolic panel with GFR  Collection Time: 08/11/23 11:25 AM  Result Value Ref Range   Glucose 180 (H) 70 - 99 mg/dL   BUN 11 6 - 20 mg/dL   Creatinine, Ser 9.19 0.57 - 1.00 mg/dL   eGFR 892 >40 fO/fpw/8.26   BUN/Creatinine Ratio 14 9 - 23   Sodium 136 134 - 144 mmol/L   Potassium 4.4 3.5 - 5.2 mmol/L   Chloride 103 96 - 106 mmol/L   CO2 19 (L) 20 - 29 mmol/L   Calcium  9.6 8.7 - 10.2 mg/dL   Total Protein 6.6 6.0 - 8.5 g/dL   Albumin 4.3 4.0 - 5.0 g/dL   Globulin, Total 2.3 1.5 - 4.5 g/dL   Bilirubin Total 0.4 0.0 - 1.2 mg/dL   Alkaline Phosphatase 87 44 - 121 IU/L   AST 16 0 - 40 IU/L   ALT 18 0 - 32 IU/L  Lipid panel   Collection Time: 08/11/23 11:25 AM  Result Value Ref Range   Cholesterol, Total 187 100 - 199 mg/dL   Triglycerides 886 0 - 149 mg/dL   HDL 38 (L) >60 mg/dL   VLDL Cholesterol Cal 21 5 - 40 mg/dL   LDL Chol Calc (NIH) 871 (H) 0 - 99 mg/dL   Chol/HDL Ratio 4.9 (H) 0.0 - 4.4 ratio  TSH   Collection Time: 08/11/23 11:25 AM  Result Value Ref Range   TSH 2.570 0.450 - 4.500 uIU/mL  HgB A1c   Collection  Time: 08/11/23 11:25 AM  Result Value Ref Range   Hgb A1c MFr Bld 7.1 (H) 4.8 - 5.6 %   Est. average glucose Bld gHb Est-mCnc 157 mg/dL      Assessment & Plan:   Problem List Items Addressed This Visit       Endocrine   Controlled type 2 diabetes mellitus without complication, without long-term current use of insulin (HCC) - Primary   Chronic.  Controlled.  Patient has decided to start Metformin.  Will start 500mg  BID.  Side effects and benefits of medication discussed.  Patient would also like to change from Lisinopril  to Losartan- advised about possible low blood pressures.  Patient aware of risk and okay with taking medication.  Follow up in 3 months.        Relevant Medications   metFORMIN (GLUCOPHAGE) 500 MG tablet   losartan (COZAAR) 25 MG tablet      Follow up plan: No follow-ups on file.  This visit was completed via MyChart due to the restrictions of the COVID-19 pandemic. All issues as above were discussed and addressed. Physical exam was done as above through visual confirmation on MyChart. If it was felt that the patient should be evaluated in the office, they were directed there. The patient verbally consented to this visit. Location of the patient: Home Location of the provider: Office Those involved with this call:  Provider: Darice Petty, NP CMA: Clotilda Eagles, CMA Front Desk/Registration: Claretta Maiden This encounter was conducted via video.  I spent 30 minutes dedicated to the care of this patient on the date of this encounter to include previsit review of medications, plan of care, benefits and risks and follow up, face to face time with the patient, and post visit ordering of testing.

## 2023-11-23 ENCOUNTER — Ambulatory Visit (INDEPENDENT_AMBULATORY_CARE_PROVIDER_SITE_OTHER): Admitting: Nurse Practitioner

## 2023-11-23 ENCOUNTER — Encounter: Payer: Self-pay | Admitting: Nurse Practitioner

## 2023-11-23 VITALS — BP 118/87 | HR 106 | Temp 98.7°F | Ht 61.0 in | Wt 258.8 lb

## 2023-11-23 DIAGNOSIS — E782 Mixed hyperlipidemia: Secondary | ICD-10-CM

## 2023-11-23 DIAGNOSIS — F419 Anxiety disorder, unspecified: Secondary | ICD-10-CM | POA: Diagnosis not present

## 2023-11-23 DIAGNOSIS — E119 Type 2 diabetes mellitus without complications: Secondary | ICD-10-CM

## 2023-11-23 LAB — MICROALBUMIN, URINE WAIVED
Creatinine, Urine Waived: 200 mg/dL (ref 10–300)
Microalb, Ur Waived: 80 mg/L — ABNORMAL HIGH (ref 0–19)

## 2023-11-23 MED ORDER — HYDROXYZINE PAMOATE 25 MG PO CAPS: 25.0000 mg | ORAL_CAPSULE | Freq: Three times a day (TID) | ORAL | 0 refills | Status: DC | PRN

## 2023-11-23 NOTE — Assessment & Plan Note (Signed)
 Chronic.  Controlled.  Continue with current medication regimen of Metformin  500mg  BID.  Not up to date on Eye exam.  Microalbumin checked today.  Labs ordered today.  Return to clinic in 3 months for reevaluation.  Call sooner if concerns arise.

## 2023-11-23 NOTE — Progress Notes (Signed)
 BP 118/87 (BP Location: Left Arm, Patient Position: Sitting, Cuff Size: Large)   Pulse (!) 106   Temp 98.7 F (37.1 C) (Oral)   Ht 5' 1 (1.549 m)   Wt 258 lb 12.8 oz (117.4 kg)   SpO2 96%   BMI 48.90 kg/m    Subjective:    Patient ID: Penny Wilson, female    DOB: 2000/08/30, 23 y.o.   MRN: [id number]  HPI: Penny Wilson is a 23 y.o. female  Chief Complaint  Patient presents with   Hypertension   Hyperlipidemia   Diabetes    DIABETES Hypoglycemic episodes:no Polydipsia/polyuria: no Visual disturbance: no Chest pain: no Paresthesias: no Glucose Monitoring: yes  Accucheck frequency: Daily  Fasting glucose: 90-130  Post prandial:  Evening:  Before meals: Taking Insulin?: no  Long acting insulin:  Short acting insulin: Blood Pressure Monitoring: not checking Retinal Examination: Not up to Date Foot Exam: Up to Date Diabetic Education: Not Completed Pneumovax: Not up to Date Influenza: Not up to Date Aspirin : no  MOOD Patient states she has been dealing with anxiety lately.  Being in public places and she feels like she over thinks a lot.  Has a lot of panic attacks.  She has never been on medication for anxiety.  She would like to try hydroxyzine  for her anxiety.  Denies SI.     11/23/2023    3:11 PM 08/22/2023    1:12 PM 04/22/2021   10:34 AM 03/17/2021    1:21 PM  GAD 7 : Generalized Anxiety Score  Nervous, Anxious, on Edge 3 0 1 3  Control/stop worrying 2 0 1 3  Worry too much - different things 2 0 0 3  Trouble relaxing 1 0 0 2  Restless 0 0 0 1  Easily annoyed or irritable 0 0 0 2  Afraid - awful might happen 0 0 0 3  Total GAD 7 Score 8 0 2 17  Anxiety Difficulty  Not difficult at all Not difficult at all Very difficult      Relevant past medical, surgical, family and social history reviewed and updated as indicated. Interim medical history since our last visit reviewed. Allergies and medications reviewed and updated.  Review of Systems   Eyes:  Negative for visual disturbance.  Cardiovascular:  Negative for chest pain.  Endocrine: Negative for polydipsia and polyuria.  Neurological:  Negative for numbness.  Psychiatric/Behavioral:  The patient is nervous/anxious.     Per HPI unless specifically indicated above     Objective:    BP 118/87 (BP Location: Left Arm, Patient Position: Sitting, Cuff Size: Large)   Pulse (!) 106   Temp 98.7 F (37.1 C) (Oral)   Ht 5' 1 (1.549 m)   Wt 258 lb 12.8 oz (117.4 kg)   SpO2 96%   BMI 48.90 kg/m   Wt Readings from Last 3 Encounters:  11/23/23 258 lb 12.8 oz (117.4 kg)  08/22/23 260 lb (117.9 kg)  08/11/23 262 lb 3.2 oz (118.9 kg)    Physical Exam Vitals and nursing note reviewed.  Constitutional:      General: She is not in acute distress.    Appearance: Normal appearance. She is obese. She is not ill-appearing, toxic-appearing or diaphoretic.  HENT:     Head: Normocephalic.     Right Ear: External ear normal.     Left Ear: External ear normal.     Nose: Nose normal.     Mouth/Throat:  Mouth: Mucous membranes are moist.     Pharynx: Oropharynx is clear.  Eyes:     General:        Right eye: No discharge.        Left eye: No discharge.     Extraocular Movements: Extraocular movements intact.     Conjunctiva/sclera: Conjunctivae normal.     Pupils: Pupils are equal, round, and reactive to light.  Cardiovascular:     Rate and Rhythm: Normal rate and regular rhythm.     Heart sounds: No murmur heard. Pulmonary:     Effort: Pulmonary effort is normal. No respiratory distress.     Breath sounds: Normal breath sounds. No wheezing or rales.  Musculoskeletal:     Cervical back: Normal range of motion and neck supple.  Skin:    General: Skin is warm and dry.     Capillary Refill: Capillary refill takes less than 2 seconds.  Neurological:     General: No focal deficit present.     Mental Status: She is alert and oriented to person, place, and time. Mental status  is at baseline.  Psychiatric:        Mood and Affect: Mood normal.        Behavior: Behavior normal.        Thought Content: Thought content normal.        Judgment: Judgment normal.     Results for orders placed or performed in visit on 08/11/23  Chlamydia/Gonococcus/Trichomonas, NAA([facility])   Collection Time: 08/11/23 11:25 AM   Specimen: Urine   UR  Result Value Ref Range   Chlamydia by NAA Negative Negative   Gonococcus by NAA Negative Negative   Trich vag by NAA Negative Negative  CBC with Differential/Platelet   Collection Time: 08/11/23 11:25 AM  Result Value Ref Range   WBC 10.3 3.4 - 10.8 x10E3/uL   RBC 4.68 3.77 - 5.28 x10E6/uL   Hemoglobin 14.0 11.1 - 15.9 g/dL   Hematocrit 57.5 65.9 - 46.6 %   MCV 91 79 - 97 fL   MCH 29.9 26.6 - 33.0 pg   MCHC 33.0 31.5 - 35.7 g/dL   RDW 87.9 88.2 - 84.5 %   Platelets 330 150 - 450 x10E3/uL   Neutrophils 62 Not Estab. %   Lymphs 29 Not Estab. %   Monocytes 5 Not Estab. %   Eos 3 Not Estab. %   Basos 1 Not Estab. %   Neutrophils Absolute 6.5 1.4 - 7.0 x10E3/uL   Lymphocytes Absolute 3.0 0.7 - 3.1 x10E3/uL   Monocytes Absolute 0.5 0.1 - 0.9 x10E3/uL   EOS (ABSOLUTE) 0.3 0.0 - 0.4 x10E3/uL   Basophils Absolute 0.1 0.0 - 0.2 x10E3/uL   Immature Granulocytes 0 Not Estab. %   Immature Grans (Abs) 0.0 0.0 - 0.1 x10E3/uL  Comprehensive metabolic panel with GFR   Collection Time: 08/11/23 11:25 AM  Result Value Ref Range   Glucose 180 (H) 70 - 99 mg/dL   BUN 11 6 - 20 mg/dL   Creatinine, Ser 9.19 0.57 - 1.00 mg/dL   eGFR 892 >40 fO/fpw/8.26   BUN/Creatinine Ratio 14 9 - 23   Sodium 136 134 - 144 mmol/L   Potassium 4.4 3.5 - 5.2 mmol/L   Chloride 103 96 - 106 mmol/L   CO2 19 (L) 20 - 29 mmol/L   Calcium  9.6 8.7 - 10.2 mg/dL   Total Protein 6.6 6.0 - 8.5 g/dL   Albumin 4.3 4.0 - 5.0 g/dL  Globulin, Total 2.3 1.5 - 4.5 g/dL   Bilirubin Total 0.4 0.0 - 1.2 mg/dL   Alkaline Phosphatase 87 44 - 121 IU/L   AST 16 0 - 40 IU/L    ALT 18 0 - 32 IU/L  Lipid panel   Collection Time: 08/11/23 11:25 AM  Result Value Ref Range   Cholesterol, Total 187 100 - 199 mg/dL   Triglycerides 886 0 - 149 mg/dL   HDL 38 (L) >60 mg/dL   VLDL Cholesterol Cal 21 5 - 40 mg/dL   LDL Chol Calc (NIH) 871 (H) 0 - 99 mg/dL   Chol/HDL Ratio 4.9 (H) 0.0 - 4.4 ratio  TSH   Collection Time: 08/11/23 11:25 AM  Result Value Ref Range   TSH 2.570 0.450 - 4.500 uIU/mL  HgB A1c   Collection Time: 08/11/23 11:25 AM  Result Value Ref Range   Hgb A1c MFr Bld 7.1 (H) 4.8 - 5.6 %   Est. average glucose Bld gHb Est-mCnc 157 mg/dL      Assessment & Plan:   Problem List Items Addressed This Visit       Endocrine   Controlled type 2 diabetes mellitus without complication, without long-term current use of insulin (HCC) - Primary   Chronic.  Controlled.  Continue with current medication regimen of Metformin  500mg  BID.  Not up to date on Eye exam.  Microalbumin checked today.  Labs ordered today.  Return to clinic in 3 months for reevaluation.  Call sooner if concerns arise.        Relevant Orders   Comprehensive metabolic panel with GFR   Hemoglobin A1c   Microalbumin, Urine Waived     Other   Anxiety   Chronic. Not well controlled.  Will start Hydroxyzine  25mg  PRN.  Discussed side effects and benefits of medications during visit.  Follow up in 3 months.  Call sooner if concerns arise.      Relevant Medications   hydrOXYzine  (VISTARIL ) 25 MG capsule   Mixed hyperlipidemia   Relevant Orders   Lipid panel     Follow up plan: Return in about 3 months (around 02/22/2024) for HTN, HLD, DM2 FU.

## 2023-11-23 NOTE — Assessment & Plan Note (Signed)
 Chronic. Not well controlled.  Will start Hydroxyzine  25mg  PRN.  Discussed side effects and benefits of medications during visit.  Follow up in 3 months.  Call sooner if concerns arise.

## 2023-11-24 ENCOUNTER — Ambulatory Visit: Payer: Self-pay | Admitting: Nurse Practitioner

## 2023-11-24 LAB — COMPREHENSIVE METABOLIC PANEL WITH GFR
ALT: 23 IU/L (ref 0–32)
AST: 15 IU/L (ref 0–40)
Albumin: 4.3 g/dL (ref 4.0–5.0)
Alkaline Phosphatase: 83 IU/L (ref 44–121)
BUN/Creatinine Ratio: 15 (ref 9–23)
BUN: 14 mg/dL (ref 6–20)
Bilirubin Total: 0.3 mg/dL (ref 0.0–1.2)
CO2: 23 mmol/L (ref 20–29)
Calcium: 9.8 mg/dL (ref 8.7–10.2)
Chloride: 101 mmol/L (ref 96–106)
Creatinine, Ser: 0.92 mg/dL (ref 0.57–1.00)
Globulin, Total: 2.4 g/dL (ref 1.5–4.5)
Glucose: 134 mg/dL — ABNORMAL HIGH (ref 70–99)
Potassium: 4.5 mmol/L (ref 3.5–5.2)
Sodium: 139 mmol/L (ref 134–144)
Total Protein: 6.7 g/dL (ref 6.0–8.5)
eGFR: 90 mL/min/1.73 (ref >59)

## 2023-11-24 LAB — LIPID PANEL
Chol/HDL Ratio: 4.1 ratio (ref 0.0–4.4)
Cholesterol, Total: 130 mg/dL (ref 100–199)
HDL: 32 mg/dL — ABNORMAL LOW (ref >39)
LDL Chol Calc (NIH): 71 mg/dL (ref 0–99)
Triglycerides: 156 mg/dL — ABNORMAL HIGH (ref 0–149)
VLDL Cholesterol Cal: 27 mg/dL (ref 5–40)

## 2023-11-24 LAB — HEMOGLOBIN A1C
Est. average glucose Bld gHb Est-mCnc: 140 mg/dL
Hgb A1c MFr Bld: 6.5 % — ABNORMAL HIGH (ref 4.8–5.6)

## 2023-11-26 ENCOUNTER — Other Ambulatory Visit: Payer: Self-pay | Admitting: Nurse Practitioner

## 2023-11-28 NOTE — Telephone Encounter (Signed)
 Requested Prescriptions  Pending Prescriptions Disp Refills   losartan  (COZAAR ) 25 MG tablet [Pharmacy Med Name: LOSARTAN  POTASSIUM 25 MG TAB] 90 tablet 1    Sig: TAKE 1 TABLET (25 MG TOTAL) BY MOUTH DAILY.     Cardiovascular:  Angiotensin Receptor Blockers Passed - 11/28/2023 11:54 AM      Passed - Cr in normal range and within 180 days    Creatinine, Ser  Date Value Ref Range Status  11/23/2023 0.92 0.57 - 1.00 mg/dL Final   Creatinine, Urine  Date Value Ref Range Status  01/15/2020 213 mg/dL Final         Passed - K in normal range and within 180 days    Potassium  Date Value Ref Range Status  11/23/2023 4.5 3.5 - 5.2 mmol/L Final         Passed - Patient is not pregnant      Passed - Last BP in normal range    BP Readings from Last 1 Encounters:  11/23/23 118/87         Passed - Valid encounter within last 6 months    Recent Outpatient Visits           5 days ago Controlled type 2 diabetes mellitus without complication, without long-term current use of insulin (HCC)   [place] [facility] Melvin Pao, NP   2 months ago Controlled type 2 diabetes mellitus without complication, without long-term current use of insulin (HCC)   [place] [facility] Melvin Pao, NP   3 months ago Controlled type 2 diabetes mellitus without complication, without long-term current use of insulin (HCC)   [place] [facility] Melvin Pao, NP   3 months ago Annual physical exam   [place] [facility] Melvin Pao, NP

## 2023-12-19 ENCOUNTER — Other Ambulatory Visit: Payer: Self-pay | Admitting: Nurse Practitioner

## 2023-12-21 NOTE — Telephone Encounter (Signed)
 Requested Prescriptions  Pending Prescriptions Disp Refills   metFORMIN  (GLUCOPHAGE ) 500 MG tablet [Pharmacy Med Name: METFORMIN  HCL 500 MG TABLET] 180 tablet 0    Sig: TAKE 1 TABLET BY MOUTH 2 TIMES DAILY WITH A MEAL.     Endocrinology:  Diabetes - Biguanides Failed - 12/21/2023  1:18 PM      Failed - B12 Level in normal range and within 720 days    No results found for: VITAMINB12       Passed - Cr in normal range and within 360 days    Creatinine, Ser  Date Value Ref Range Status  11/23/2023 0.92 0.57 - 1.00 mg/dL Final   Creatinine, Urine  Date Value Ref Range Status  01/15/2020 213 mg/dL Final         Passed - HBA1C is between 0 and 7.9 and within 180 days    Hgb A1c MFr Bld  Date Value Ref Range Status  11/23/2023 6.5 (H) 4.8 - 5.6 % Final    Comment:             Prediabetes: 5.7 - 6.4          Diabetes: >6.4          Glycemic control for adults with diabetes: <7.0          Passed - eGFR in normal range and within 360 days    GFR calc Af Amer  Date Value Ref Range Status  09/25/2019 157 >59 mL/min/1.73 Final    Comment:    **[facility] currently reports eGFR in compliance with the current**   recommendations of the [employer]. [facility] will   update reporting as new guidelines are published from the NKF-ASN   Task force.    GFR, Estimated  Date Value Ref Range Status  01/15/2020 >60 >60 mL/min Final    Comment:    (NOTE) Calculated using the CKD-EPI Creatinine Equation (2021)    eGFR  Date Value Ref Range Status  11/23/2023 90 >59 mL/min/1.73 Final         Passed - Valid encounter within last 6 months    Recent Outpatient Visits           4 weeks ago Controlled type 2 diabetes mellitus without complication, without long-term current use of insulin (HCC)   [place] [facility] [place], Darice, NP   3 months ago Controlled type 2 diabetes mellitus without complication, without long-term current use of insulin (HCC)    [place] [facility] [place], Darice, NP   4 months ago Controlled type 2 diabetes mellitus without complication, without long-term current use of insulin (HCC)   [place] [facility] [place], Darice, NP   4 months ago Annual physical exam    [facility] Melvin Darice, NP              Passed - CBC within normal limits and completed in the last 12 months    WBC  Date Value Ref Range Status  08/11/2023 10.3 3.4 - 10.8 x10E3/uL Final  01/17/2020 11.3 (H) 4.0 - 10.5 K/uL Final   RBC  Date Value Ref Range Status  08/11/2023 4.68 3.77 - 5.28 x10E6/uL Final  01/17/2020 3.55 (L) 3.87 - 5.11 MIL/uL Final   Hemoglobin  Date Value Ref Range Status  08/11/2023 14.0 11.1 - 15.9 g/dL Final   Hematocrit  Date Value Ref Range Status  08/11/2023 42.4 34.0 - 46.6 % Final   MCHC  Date  Value Ref Range Status  08/11/2023 33.0 31.5 - 35.7 g/dL Final  88/94/7978 68.1 30.0 - 36.0 g/dL Final   [facility]  Date Value Ref Range Status  08/11/2023 29.9 26.6 - 33.0 pg Final  01/17/2020 25.9 (L) 26.0 - 34.0 pg Final   MCV  Date Value Ref Range Status  08/11/2023 91 79 - 97 fL Final   No results found for: PLTCOUNTKUC, LABPLAT, POCPLA RDW  Date Value Ref Range Status  08/11/2023 12.0 11.7 - 15.4 % Final

## 2023-12-24 ENCOUNTER — Other Ambulatory Visit: Payer: Self-pay | Admitting: Nurse Practitioner

## 2023-12-26 NOTE — Telephone Encounter (Signed)
 Requested Prescriptions  Pending Prescriptions Disp Refills   hydrOXYzine  (VISTARIL ) 25 MG capsule [Pharmacy Med Name: HYDROXYZINE  PAM 25 MG CAP] 90 capsule 0    Sig: TAKE 1 CAPSULE (25 MG TOTAL) BY MOUTH EVERY 8 (EIGHT) HOURS AS NEEDED.     Ear, Nose, and Throat:  Antihistamines 2 Passed - 12/26/2023  2:49 PM      Passed - Cr in normal range and within 360 days    Creatinine, Ser  Date Value Ref Range Status  11/23/2023 0.92 0.57 - 1.00 mg/dL Final   Creatinine, Urine  Date Value Ref Range Status  01/15/2020 213 mg/dL Final         Passed - Valid encounter within last 12 months    Recent Outpatient Visits           1 month ago Controlled type 2 diabetes mellitus without complication, without long-term current use of insulin (HCC)   [place] [facility] Melvin Pao, NP   3 months ago Controlled type 2 diabetes mellitus without complication, without long-term current use of insulin [facility])   [place] [facility] Melvin Pao, NP   4 months ago Controlled type 2 diabetes mellitus without complication, without long-term current use of insulin (HCC)   [place] [facility] Melvin Pao, NP   4 months ago Annual physical exam   [place] [facility] Melvin Pao, NP

## 2024-02-13 ENCOUNTER — Other Ambulatory Visit: Payer: Self-pay | Admitting: Nurse Practitioner

## 2024-02-16 NOTE — Telephone Encounter (Signed)
 Requested by interface surescripts. Future visit 02/26/24. Requested Prescriptions  Pending Prescriptions Disp Refills   hydrOXYzine  (VISTARIL ) 25 MG capsule [Pharmacy Med Name: HYDROXYZINE  PAM 25 MG CAP] 90 capsule 0    Sig: TAKE 1 CAPSULE (25 MG TOTAL) BY MOUTH EVERY 8 (EIGHT) HOURS AS NEEDED.     Ear, Nose, and Throat:  Antihistamines 2 Passed - 02/16/2024 12:08 PM      Passed - Cr in normal range and within 360 days    Creatinine, Ser  Date Value Ref Range Status  11/23/2023 0.92 0.57 - 1.00 mg/dL Final   Creatinine, Urine  Date Value Ref Range Status  01/15/2020 213 mg/dL Final         Passed - Valid encounter within last 12 months    Recent Outpatient Visits           2 months ago Controlled type 2 diabetes mellitus without complication, without long-term current use of insulin (HCC)   [place] [facility] Melvin Pao, NP   5 months ago Controlled type 2 diabetes mellitus without complication, without long-term current use of insulin [facility])   [place] [facility] Melvin Pao, NP   5 months ago Controlled type 2 diabetes mellitus without complication, without long-term current use of insulin (HCC)   [place] [facility] Melvin Pao, NP   6 months ago Annual physical exam   [place] [facility] Melvin Pao, NP

## 2024-02-26 ENCOUNTER — Ambulatory Visit: Admitting: Nurse Practitioner

## 2024-02-26 ENCOUNTER — Encounter: Payer: Self-pay | Admitting: Nurse Practitioner

## 2024-02-26 VITALS — BP 129/82 | HR 101 | Temp 98.4°F | Ht 60.98 in | Wt 264.4 lb

## 2024-02-26 DIAGNOSIS — Z7984 Long term (current) use of oral hypoglycemic drugs: Secondary | ICD-10-CM

## 2024-02-26 DIAGNOSIS — E782 Mixed hyperlipidemia: Secondary | ICD-10-CM

## 2024-02-26 DIAGNOSIS — F321 Major depressive disorder, single episode, moderate: Secondary | ICD-10-CM

## 2024-02-26 DIAGNOSIS — F419 Anxiety disorder, unspecified: Secondary | ICD-10-CM

## 2024-02-26 DIAGNOSIS — Z6839 Body mass index (BMI) 39.0-39.9, adult: Secondary | ICD-10-CM

## 2024-02-26 DIAGNOSIS — E119 Type 2 diabetes mellitus without complications: Secondary | ICD-10-CM

## 2024-02-26 NOTE — Assessment & Plan Note (Signed)
 Chronic.  Controlled.  Continue with current medication regimen of hydroxyzine .  Refills sent today.  Labs ordered today.  Return to clinic in 6 months for reevaluation.  Call sooner if concerns arise.

## 2024-02-26 NOTE — Assessment & Plan Note (Signed)
 Recommended eating smaller high protein, low fat meals more frequently and exercising 30 mins a day 5 times a week with a goal of 10-15lb weight loss in the next 3 months.

## 2024-02-26 NOTE — Assessment & Plan Note (Signed)
 Chronic.  Well controlled.  Last A1c was controlled at 6.5% in September.  Currently on Metformin  500mg  BID.  Tolerating medication well.  Discussed eye exam.  Labs ordered.  Follow up in 6 months.  Call sooner if concerns arise.

## 2024-02-26 NOTE — Progress Notes (Signed)
 BP 129/82 (BP Location: Right Wrist, Patient Position: Sitting, Cuff Size: Normal)   Pulse (!) 101   Temp 98.4 F (36.9 C) (Oral)   Ht 5' 0.98 (1.549 m)   Wt 264 lb 6.4 oz (119.9 kg)   SpO2 98%   BMI 49.98 kg/m    Subjective:    Patient ID: Penny Wilson, female    DOB: 2000-12-01, 23 y.o.   MRN: [id number]  HPI: Penny Wilson is a 23 y.o. female  Chief Complaint  Patient presents with   office visit    3 month F/u.    DIABETES Patient states she is tolerating Metformin  well.   Hypoglycemic episodes:no Polydipsia/polyuria: no Visual disturbance: no Chest pain: no Paresthesias: no Glucose Monitoring: yes  Accucheck frequency: Daily  Fasting glucose: 90-140-150  Post prandial:  Evening:  Before meals: Taking Insulin?: no  Long acting insulin:  Short acting insulin: Blood Pressure Monitoring: not checking Retinal Examination: Not up to Date Foot Exam: Up to Date Diabetic Education: Not Completed Pneumovax: Not up to Date Influenza: Not up to Date Aspirin : no  MOOD Patient states she feels like the hydroxyzine  is helping with her mood.  Feels like it helps settle her down and mellows her out.  She feels like this is a good regimen for her.   Denies SI.     02/26/2024    2:22 PM 11/23/2023    3:11 PM 08/22/2023    1:12 PM 04/22/2021   10:34 AM  GAD 7 : Generalized Anxiety Score  Nervous, Anxious, on Edge 0 3 0 1  Control/stop worrying 0 2 0 1  Worry too much - different things 0 2 0 0  Trouble relaxing 0 1 0 0  Restless 0 0 0 0  Easily annoyed or irritable 0 0 0 0  Afraid - awful might happen 0 0 0 0  Total GAD 7 Score 0 8 0 2  Anxiety Difficulty Not difficult at all  Not difficult at all Not difficult at all      Relevant past medical, surgical, family and social history reviewed and updated as indicated. Interim medical history since our last visit reviewed. Allergies and medications reviewed and updated.  Review of Systems  Eyes:  Negative  for visual disturbance.  Cardiovascular:  Negative for chest pain.  Endocrine: Negative for polydipsia and polyuria.  Neurological:  Negative for numbness.  Psychiatric/Behavioral:  The patient is nervous/anxious.     Per HPI unless specifically indicated above     Objective:    BP 129/82 (BP Location: Right Wrist, Patient Position: Sitting, Cuff Size: Normal)   Pulse (!) 101   Temp 98.4 F (36.9 C) (Oral)   Ht 5' 0.98 (1.549 m)   Wt 264 lb 6.4 oz (119.9 kg)   SpO2 98%   BMI 49.98 kg/m   Wt Readings from Last 3 Encounters:  02/26/24 264 lb 6.4 oz (119.9 kg)  11/23/23 258 lb 12.8 oz (117.4 kg)  08/22/23 260 lb (117.9 kg)    Physical Exam Vitals and nursing note reviewed.  Constitutional:      General: She is not in acute distress.    Appearance: Normal appearance. She is obese. She is not ill-appearing, toxic-appearing or diaphoretic.  HENT:     Head: Normocephalic.     Right Ear: External ear normal.     Left Ear: External ear normal.     Nose: Nose normal.     Mouth/Throat:     Mouth:  Mucous membranes are moist.     Pharynx: Oropharynx is clear.  Eyes:     General:        Right eye: No discharge.        Left eye: No discharge.     Extraocular Movements: Extraocular movements intact.     Conjunctiva/sclera: Conjunctivae normal.     Pupils: Pupils are equal, round, and reactive to light.  Cardiovascular:     Rate and Rhythm: Normal rate and regular rhythm.     Heart sounds: No murmur heard. Pulmonary:     Effort: Pulmonary effort is normal. No respiratory distress.     Breath sounds: Normal breath sounds. No wheezing or rales.  Musculoskeletal:     Cervical back: Normal range of motion and neck supple.  Skin:    General: Skin is warm and dry.     Capillary Refill: Capillary refill takes less than 2 seconds.  Neurological:     General: No focal deficit present.     Mental Status: She is alert and oriented to person, place, and time. Mental status is at  baseline.  Psychiatric:        Mood and Affect: Mood normal.        Behavior: Behavior normal.        Thought Content: Thought content normal.        Judgment: Judgment normal.     Results for orders placed or performed in visit on 11/23/23  Microalbumin, Urine Waived   Collection Time: 11/23/23  3:23 PM  Result Value Ref Range   Microalb, Ur Waived 80 (H) 0 - 19 mg/L   Creatinine, Urine Waived 200 10 - 300 mg/dL   Microalb/Creat Ratio 30-300 (H) <30 mg/g  Comprehensive metabolic panel with GFR   Collection Time: 11/23/23  3:24 PM  Result Value Ref Range   Glucose 134 (H) 70 - 99 mg/dL   BUN 14 6 - 20 mg/dL   Creatinine, Ser 9.07 0.57 - 1.00 mg/dL   eGFR 90 >40 fO/fpw/8.26   BUN/Creatinine Ratio 15 9 - 23   Sodium 139 134 - 144 mmol/L   Potassium 4.5 3.5 - 5.2 mmol/L   Chloride 101 96 - 106 mmol/L   CO2 23 20 - 29 mmol/L   Calcium  9.8 8.7 - 10.2 mg/dL   Total Protein 6.7 6.0 - 8.5 g/dL   Albumin 4.3 4.0 - 5.0 g/dL   Globulin, Total 2.4 1.5 - 4.5 g/dL   Bilirubin Total 0.3 0.0 - 1.2 mg/dL   Alkaline Phosphatase 83 44 - 121 IU/L   AST 15 0 - 40 IU/L   ALT 23 0 - 32 IU/L  Hemoglobin A1c   Collection Time: 11/23/23  3:24 PM  Result Value Ref Range   Hgb A1c MFr Bld 6.5 (H) 4.8 - 5.6 %   Est. average glucose Bld gHb Est-mCnc 140 mg/dL  Lipid panel   Collection Time: 11/23/23  3:24 PM  Result Value Ref Range   Cholesterol, Total 130 100 - 199 mg/dL   Triglycerides 843 (H) 0 - 149 mg/dL   HDL 32 (L) >60 mg/dL   VLDL Cholesterol Cal 27 5 - 40 mg/dL   LDL Chol Calc (NIH) 71 0 - 99 mg/dL   Chol/HDL Ratio 4.1 0.0 - 4.4 ratio      Assessment & Plan:   Problem List Items Addressed This Visit       Endocrine   Controlled type 2 diabetes mellitus without complication, without long-term current  use of insulin (HCC)   Chronic.  Well controlled.  Last A1c was controlled at 6.5% in September.  Currently on Metformin  500mg  BID.  Tolerating medication well.  Discussed eye exam.   Labs ordered.  Follow up in 6 months.  Call sooner if concerns arise.       Relevant Orders   Comprehensive metabolic panel with GFR   Hemoglobin A1c     Other   BMI 39.0-39.9,adult   Recommended eating smaller high protein, low fat meals more frequently and exercising 30 mins a day 5 times a week with a goal of 10-15lb weight loss in the next 3 months.       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  Controlled.  Continue with current medication regimen of hydroxyzine .  Refills sent today.  Labs ordered today.  Return to clinic in 6 months for reevaluation.  Call sooner if concerns arise.        Mixed hyperlipidemia   Relevant Orders   Lipid panel     Follow up plan: Return in about 6 months (around 08/26/2024) for Physical and Fasting labs.

## 2024-02-27 ENCOUNTER — Encounter: Payer: Self-pay | Admitting: Nurse Practitioner

## 2024-02-27 ENCOUNTER — Ambulatory Visit: Payer: Self-pay | Admitting: Nurse Practitioner

## 2024-02-27 LAB — COMPREHENSIVE METABOLIC PANEL WITH GFR
ALT: 17 IU/L (ref 0–32)
AST: 13 IU/L (ref 0–40)
Albumin: 4.1 g/dL (ref 4.0–5.0)
Alkaline Phosphatase: 73 IU/L (ref 41–116)
BUN/Creatinine Ratio: 18 (ref 9–23)
BUN: 14 mg/dL (ref 6–20)
Bilirubin Total: 0.3 mg/dL (ref 0.0–1.2)
CO2: 18 mmol/L — ABNORMAL LOW (ref 20–29)
Calcium: 9.6 mg/dL (ref 8.7–10.2)
Chloride: 101 mmol/L (ref 96–106)
Creatinine, Ser: 0.8 mg/dL (ref 0.57–1.00)
Globulin, Total: 2.6 g/dL (ref 1.5–4.5)
Glucose: 213 mg/dL — ABNORMAL HIGH (ref 70–99)
Potassium: 4 mmol/L (ref 3.5–5.2)
Sodium: 136 mmol/L (ref 134–144)
Total Protein: 6.7 g/dL (ref 6.0–8.5)
eGFR: 106 mL/min/1.73 (ref >59)

## 2024-02-27 LAB — LIPID PANEL
Chol/HDL Ratio: 4.6 ratio — ABNORMAL HIGH (ref 0.0–4.4)
Cholesterol, Total: 157 mg/dL (ref 100–199)
HDL: 34 mg/dL — ABNORMAL LOW (ref >39)
LDL Chol Calc (NIH): 96 mg/dL (ref 0–99)
Triglycerides: 151 mg/dL — ABNORMAL HIGH (ref 0–149)
VLDL Cholesterol Cal: 27 mg/dL (ref 5–40)

## 2024-02-27 LAB — HEMOGLOBIN A1C
Est. average glucose Bld gHb Est-mCnc: 174 mg/dL
Hgb A1c MFr Bld: 7.7 % — ABNORMAL HIGH (ref 4.8–5.6)

## 2024-02-27 MED ORDER — METFORMIN HCL ER 750 MG PO TB24: 750.0000 mg | ORAL_TABLET | Freq: Two times a day (BID) | ORAL | 1 refills | Status: AC

## 2024-03-09 ENCOUNTER — Other Ambulatory Visit: Payer: Self-pay | Admitting: Nurse Practitioner

## 2024-03-16 ENCOUNTER — Encounter: Payer: Self-pay | Admitting: Nurse Practitioner

## 2024-05-28 ENCOUNTER — Ambulatory Visit: Admitting: Nurse Practitioner

## 2024-08-15 ENCOUNTER — Encounter: Admitting: Nurse Practitioner

## 2024-08-26 ENCOUNTER — Encounter: Admitting: Nurse Practitioner
